# Patient Record
Sex: Male | Born: 1995 | Race: White | Hispanic: No | Marital: Single | Smoking: Never smoker
Health system: Southern US, Community
[De-identification: ages and names within clinical notes are randomized; demographics above are authoritative.]

## PROBLEM LIST (undated history)

## (undated) DIAGNOSIS — F909 Attention-deficit hyperactivity disorder, unspecified type: Secondary | ICD-10-CM

## (undated) HISTORY — PX: LASIK: SHX215

## (undated) HISTORY — DX: Attention-deficit hyperactivity disorder, unspecified type: F90.9

## (undated) HISTORY — PX: APPENDECTOMY: SHX54

---
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
## 2013-02-17 ENCOUNTER — Other Ambulatory Visit (HOSPITAL_COMMUNITY): Payer: Self-pay | Admitting: Pulmonary Disease

## 2013-02-17 DIAGNOSIS — M549 Dorsalgia, unspecified: Secondary | ICD-10-CM

## 2013-02-17 DIAGNOSIS — M25511 Pain in right shoulder: Secondary | ICD-10-CM

## 2013-02-22 ENCOUNTER — Ambulatory Visit (HOSPITAL_COMMUNITY)
Admission: RE | Admit: 2013-02-22 | Discharge: 2013-02-22 | Disposition: A | Discharge status: Home / Self Care | Payer: Managed Care, Other (non HMO) | Source: Ambulatory Visit | Attending: Pulmonary Disease | Admitting: Pulmonary Disease

## 2013-02-22 DIAGNOSIS — M549 Dorsalgia, unspecified: Secondary | ICD-10-CM

## 2013-02-22 DIAGNOSIS — M25511 Pain in right shoulder: Secondary | ICD-10-CM

## 2013-02-22 DIAGNOSIS — M25519 Pain in unspecified shoulder: Secondary | ICD-10-CM | POA: Insufficient documentation

## 2013-02-22 DIAGNOSIS — M545 Low back pain, unspecified: Secondary | ICD-10-CM | POA: Insufficient documentation

## 2013-02-22 DIAGNOSIS — M5126 Other intervertebral disc displacement, lumbar region: Secondary | ICD-10-CM | POA: Insufficient documentation

## 2013-07-29 HISTORY — PX: SHOULDER SURGERY: SHX246

## 2014-01-04 ENCOUNTER — Encounter (HOSPITAL_COMMUNITY): Payer: Self-pay | Admitting: Emergency Medicine

## 2014-01-04 ENCOUNTER — Emergency Department (HOSPITAL_COMMUNITY)
Admission: EM | Admit: 2014-01-04 | Discharge: 2014-01-05 | Disposition: A | Discharge status: Home / Self Care | Payer: BC Managed Care – PPO | Attending: Emergency Medicine | Admitting: Emergency Medicine

## 2014-01-04 DIAGNOSIS — S61019A Laceration without foreign body of unspecified thumb without damage to nail, initial encounter: Secondary | ICD-10-CM

## 2014-01-04 DIAGNOSIS — W260XXA Contact with knife, initial encounter: Secondary | ICD-10-CM | POA: Insufficient documentation

## 2014-01-04 DIAGNOSIS — S61209A Unspecified open wound of unspecified finger without damage to nail, initial encounter: Secondary | ICD-10-CM | POA: Insufficient documentation

## 2014-01-04 DIAGNOSIS — W261XXA Contact with sword or dagger, initial encounter: Secondary | ICD-10-CM

## 2014-01-04 DIAGNOSIS — Y929 Unspecified place or not applicable: Secondary | ICD-10-CM | POA: Insufficient documentation

## 2014-01-04 DIAGNOSIS — Y9389 Activity, other specified: Secondary | ICD-10-CM | POA: Insufficient documentation

## 2014-01-04 NOTE — ED Notes (Signed)
Superficial lac to lt thumb.

## 2014-01-05 MED ORDER — IBUPROFEN 800 MG PO TABS
800.0000 mg | ORAL_TABLET | Freq: Once | ORAL | Status: AC
  Administered 2014-01-05: 800 mg via ORAL
  Filled 2014-01-05: qty 1

## 2014-01-05 NOTE — ED Provider Notes (Signed)
CSN: [id number]     Arrival date & time 01/04/14  2210 History   First MD Initiated Contact with Patient 01/04/14 2321     Chief Complaint  Patient presents with  . Laceration     (Consider location/radiation/quality/duration/timing/severity/associated sxs/prior Treatment) Patient is a 18 y.o. male presenting with skin laceration. The history is provided by the patient.  Laceration Location:  Finger Finger laceration location:  L thumb Length (cm):  2 Depth:  Cutaneous Quality: straight   Bleeding: controlled   Time since incident:  1 hour Laceration mechanism:  Knife Pain details:    Quality:  Aching   Severity:  Mild   Timing:  Constant   Progression:  Unchanged Foreign body present:  No foreign bodies Relieved by:  Nothing Worsened by:  Movement Ineffective treatments:  None tried Tetanus status:  Up to date   History reviewed. No pertinent past medical history. Past Surgical History  Procedure Laterality Date  . Shoulder surgery     History reviewed. No pertinent family history. History  Substance Use Topics  . Smoking status: Never Smoker   . Smokeless tobacco: Not on file  . Alcohol Use: No    Review of Systems  Constitutional: Negative for fever and chills.  Musculoskeletal: Negative for arthralgias, back pain and joint swelling.  Skin: Positive for wound.       Laceration   Neurological: Negative for dizziness, weakness and numbness.  Hematological: Does not bruise/bleed easily.  All other systems reviewed and are negative.     Allergies  Review of patient's allergies indicates no known allergies.  Home Medications   Prior to Admission medications   Not on File   BP 122/77  Pulse 99  Temp(Src) 98.5 F (36.9 C) (Oral)  Resp 18  Ht 6' (1.829 m)  Wt 190 lb (86.183 kg)  BMI 25.76 kg/m2  SpO2 100% Physical Exam  Nursing note and vitals reviewed. Constitutional: He is oriented to person, place, and time. He appears well-developed and  well-nourished. No distress.  HENT:  Head: Normocephalic and atraumatic.  Cardiovascular: Normal rate, regular rhythm, normal heart sounds and intact distal pulses.   No murmur heard. Pulmonary/Chest: Effort normal and breath sounds normal. No respiratory distress.  Musculoskeletal: He exhibits no edema.       Left hand: He exhibits tenderness and laceration. He exhibits normal range of motion, no bony tenderness, normal two-point discrimination, no deformity and no swelling. Normal sensation noted. Normal strength noted. He exhibits no finger abduction, no thumb/finger opposition and no wrist extension trouble.  Neurological: He is alert and oriented to person, place, and time. He exhibits normal muscle tone. Coordination normal.  Skin: Skin is warm. Laceration noted.  Laceration to the distal left thumb.  Bleeding controlled.  Pt has Full ROM of the thumb.  Distal sensation intact.  radial pulse brisk    ED Course  Procedures (including critical care time) Labs Review Labs Reviewed - No data to display  Imaging Review No results found.   EKG Interpretation None       LACERATION REPAIR Performed by: Korrey Schleicher L. Kert Shackett Authorized by: Marsia Cino L. Ezekiel Menzer Consent: Verbal consent obtained. Risks and benefits: risks, benefits and alternatives were discussed Consent given by: patient Patient identity confirmed: provided demographic data Prepped and Draped in normal sterile fashion Wound explored  Laceration Location: distal left thumb Laceration Length: 2 cm  No Foreign Bodies seen or palpated  Anesthesia: none   Irrigation method: syringe Amount of cleaning: standard  Skin closure: dermabond  Technique: topical application  Patient tolerance: Patient tolerated the procedure well with no immediate complications.  MDM   Final diagnoses:  Laceration of thumb     Laceration appears superficial, bleeding controlled. Wound edges well approximated with dermabond.   NV  intact.  Dressing applied, Td is UTD.  Pt advised to return here for any signs of infection,  Appears stable for d/c   Dabney Schanz L. Trisha Mangleriplett, PA-C 01/06/14 1424

## 2014-01-06 NOTE — ED Provider Notes (Signed)
Medical screening examination/treatment/procedure(s) were performed by non-physician practitioner and as supervising physician I was immediately available for consultation/collaboration.   Charlisa Cham, MD 01/06/14 2259 

## 2018-11-29 ENCOUNTER — Emergency Department: Payer: BC Managed Care – PPO

## 2018-11-29 ENCOUNTER — Other Ambulatory Visit: Payer: Self-pay

## 2018-11-29 ENCOUNTER — Emergency Department
Admission: EM | Admit: 2018-11-29 | Discharge: 2018-11-29 | Disposition: A | Discharge status: Home / Self Care | Payer: BC Managed Care – PPO | Attending: Emergency Medicine | Admitting: Emergency Medicine

## 2018-11-29 ENCOUNTER — Encounter: Payer: Self-pay | Admitting: Emergency Medicine

## 2018-11-29 DIAGNOSIS — M25531 Pain in right wrist: Secondary | ICD-10-CM | POA: Insufficient documentation

## 2018-11-29 DIAGNOSIS — Y999 Unspecified external cause status: Secondary | ICD-10-CM | POA: Insufficient documentation

## 2018-11-29 DIAGNOSIS — Y92410 Unspecified street and highway as the place of occurrence of the external cause: Secondary | ICD-10-CM | POA: Insufficient documentation

## 2018-11-29 DIAGNOSIS — Z23 Encounter for immunization: Secondary | ICD-10-CM | POA: Insufficient documentation

## 2018-11-29 DIAGNOSIS — S93492A Sprain of other ligament of left ankle, initial encounter: Secondary | ICD-10-CM | POA: Insufficient documentation

## 2018-11-29 DIAGNOSIS — R2232 Localized swelling, mass and lump, left upper limb: Secondary | ICD-10-CM | POA: Diagnosis not present

## 2018-11-29 DIAGNOSIS — Y939 Activity, unspecified: Secondary | ICD-10-CM | POA: Insufficient documentation

## 2018-11-29 DIAGNOSIS — T07XXXA Unspecified multiple injuries, initial encounter: Secondary | ICD-10-CM | POA: Diagnosis not present

## 2018-11-29 DIAGNOSIS — S8992XA Unspecified injury of left lower leg, initial encounter: Secondary | ICD-10-CM | POA: Diagnosis present

## 2018-11-29 DIAGNOSIS — M79672 Pain in left foot: Secondary | ICD-10-CM | POA: Diagnosis not present

## 2018-11-29 LAB — COMPREHENSIVE METABOLIC PANEL
ALT: 15 U/L (ref 0–44)
AST: 22 U/L (ref 15–41)
Albumin: 4.8 g/dL (ref 3.5–5.0)
Alkaline Phosphatase: 65 U/L (ref 38–126)
Anion gap: 13 (ref 5–15)
BUN: 12 mg/dL (ref 6–20)
CO2: 22 mmol/L (ref 22–32)
Calcium: 9.3 mg/dL (ref 8.9–10.3)
Chloride: 100 mmol/L (ref 98–111)
Creatinine, Ser: 1.28 mg/dL — ABNORMAL HIGH (ref 0.61–1.24)
GFR calc Af Amer: >60 mL/min (ref >60)
GFR calc non Af Amer: >60 mL/min (ref >60)
Glucose, Bld: 143 mg/dL — ABNORMAL HIGH (ref 70–99)
Potassium: 3.7 mmol/L (ref 3.5–5.1)
Sodium: 135 mmol/L (ref 135–145)
Total Bilirubin: 1.9 mg/dL — ABNORMAL HIGH (ref 0.3–1.2)
Total Protein: 7.5 g/dL (ref 6.5–8.1)

## 2018-11-29 LAB — CBC WITH DIFFERENTIAL/PLATELET
Abs Immature Granulocytes: 0.05 10*3/uL (ref 0.00–0.07)
Basophils Absolute: 0 10*3/uL (ref 0.0–0.1)
Basophils Relative: 0 %
Eosinophils Absolute: 0 10*3/uL (ref 0.0–0.5)
Eosinophils Relative: 0 %
HCT: 45.6 % (ref 39.0–52.0)
Hemoglobin: 16 g/dL (ref 13.0–17.0)
Immature Granulocytes: 0 %
Lymphocytes Relative: 10 %
Lymphs Abs: 1.6 10*3/uL (ref 0.7–4.0)
MCH: 30.9 pg (ref 26.0–34.0)
MCHC: 35.1 g/dL (ref 30.0–36.0)
MCV: 88 fL (ref 80.0–100.0)
Monocytes Absolute: 1 10*3/uL (ref 0.1–1.0)
Monocytes Relative: 6 %
Neutro Abs: 13.1 10*3/uL — ABNORMAL HIGH (ref 1.7–7.7)
Neutrophils Relative %: 84 %
Platelets: 263 10*3/uL (ref 150–400)
RBC: 5.18 MIL/uL (ref 4.22–5.81)
RDW: 11.8 % (ref 11.5–15.5)
WBC: 15.7 10*3/uL — ABNORMAL HIGH (ref 4.0–10.5)
nRBC: 0 % (ref 0.0–0.2)

## 2018-11-29 LAB — SAMPLE TO BLOOD BANK

## 2018-11-29 LAB — LIPASE, BLOOD: Lipase: 27 U/L (ref 11–51)

## 2018-11-29 MED ORDER — SODIUM CHLORIDE 0.9 % IV BOLUS
500.0000 mL | Freq: Once | INTRAVENOUS | Status: AC
Start: 2018-11-29 — End: 2018-11-29
  Administered 2018-11-29: 500 mL via INTRAVENOUS

## 2018-11-29 MED ORDER — TRAMADOL HCL 50 MG PO TABS: 50.0000 mg | ORAL_TABLET | Freq: Four times a day (QID) | ORAL | 0 refills | Status: DC | PRN

## 2018-11-29 MED ORDER — ONDANSETRON HCL 4 MG/2ML IJ SOLN
4.0000 mg | Freq: Once | INTRAMUSCULAR | Status: AC
  Administered 2018-11-29: 4 mg via INTRAVENOUS
  Filled 2018-11-29: qty 2

## 2018-11-29 MED ORDER — TETANUS-DIPHTH-ACELL PERTUSSIS 5-2.5-18.5 LF-MCG/0.5 IM SUSP
0.5000 mL | Freq: Once | INTRAMUSCULAR | Status: AC
  Administered 2018-11-29: 22:00:00 0.5 mL via INTRAMUSCULAR
  Filled 2018-11-29: qty 0.5

## 2018-11-29 MED ORDER — HYDROMORPHONE HCL 1 MG/ML IJ SOLN
1.0000 mg | Freq: Once | INTRAMUSCULAR | Status: AC
  Administered 2018-11-29: 21:00:00 1 mg via INTRAVENOUS
  Filled 2018-11-29: qty 1
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
## 2018-11-29 NOTE — Discharge Instructions (Signed)
Your CT scan of the head and neck did not reveal any significant injuries.  Your xrays of the right wrist, left foot, and left lower leg were also okay.  You have a sprained left ankle, and you should keep it iced and wrapped with a compression bandage for the next 48 hours.   We gave you a tetanus vaccine today.

## 2018-11-29 NOTE — ED Notes (Signed)
Pt given 100 mcg fentanyl PTA by EMS °

## 2018-12-02 ENCOUNTER — Telehealth: Payer: Self-pay | Admitting: Orthopedic Surgery

## 2018-12-02 NOTE — Telephone Encounter (Signed)
Are there any fractures ???
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
## 2018-12-03 NOTE — Telephone Encounter (Signed)
Patient was told there were no fractures. He has seen you in the past and feels more comfortable being checked out by you.

## 2018-12-03 NOTE — Telephone Encounter (Signed)
Ok Friday tomorrow
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
## 2018-12-14 ENCOUNTER — Encounter: Payer: Self-pay | Admitting: Orthopedic Surgery

## 2018-12-14 ENCOUNTER — Other Ambulatory Visit: Payer: Self-pay

## 2018-12-14 ENCOUNTER — Ambulatory Visit: Payer: BC Managed Care – PPO | Admitting: Orthopedic Surgery

## 2018-12-14 VITALS — BP 118/70 | Temp 97.4°F | Ht 70.0 in | Wt 205.0 lb

## 2018-12-14 DIAGNOSIS — M25521 Pain in right elbow: Secondary | ICD-10-CM

## 2018-12-14 DIAGNOSIS — M25531 Pain in right wrist: Secondary | ICD-10-CM | POA: Diagnosis not present

## 2018-12-14 NOTE — Patient Instructions (Signed)
shoulder get better with time and stretching
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
## 2019-06-28 ENCOUNTER — Other Ambulatory Visit: Payer: Self-pay

## 2019-06-28 DIAGNOSIS — Z20822 Contact with and (suspected) exposure to covid-19: Secondary | ICD-10-CM

## 2019-06-29 LAB — NOVEL CORONAVIRUS, NAA: SARS-CoV-2, NAA: NOT DETECTED

## 2019-08-12 ENCOUNTER — Ambulatory Visit: Payer: Managed Care, Other (non HMO) | Attending: Internal Medicine

## 2019-08-12 ENCOUNTER — Other Ambulatory Visit: Payer: Self-pay

## 2019-08-12 DIAGNOSIS — Z20822 Contact with and (suspected) exposure to covid-19: Secondary | ICD-10-CM

## 2019-08-13 LAB — NOVEL CORONAVIRUS, NAA: SARS-CoV-2, NAA: NOT DETECTED

## 2019-11-22 ENCOUNTER — Ambulatory Visit: Payer: BC Managed Care – PPO | Admitting: Family Medicine

## 2020-08-29 ENCOUNTER — Other Ambulatory Visit: Payer: Self-pay

## 2020-08-29 ENCOUNTER — Encounter: Payer: Self-pay | Admitting: Nurse Practitioner

## 2020-08-29 ENCOUNTER — Ambulatory Visit (INDEPENDENT_AMBULATORY_CARE_PROVIDER_SITE_OTHER): Payer: Self-pay | Admitting: Nurse Practitioner

## 2020-08-29 DIAGNOSIS — F909 Attention-deficit hyperactivity disorder, unspecified type: Secondary | ICD-10-CM

## 2020-08-29 DIAGNOSIS — Z139 Encounter for screening, unspecified: Secondary | ICD-10-CM

## 2020-08-29 DIAGNOSIS — Z7689 Persons encountering health services in other specified circumstances: Secondary | ICD-10-CM

## 2020-08-29 MED ORDER — AMPHETAMINE-DEXTROAMPHETAMINE 20 MG PO TABS: 20.0000 mg | ORAL_TABLET | Freq: Every day | ORAL | 0 refills | Status: DC

## 2020-08-29 NOTE — Assessment & Plan Note (Signed)
-  Will screen for HIV and HCV with next set of labs

## 2020-08-29 NOTE — Patient Instructions (Addendum)
It was great seeing you again today.  I'm glad you found me.  We will meet back up in 1 month for lab work. Please have fasting labs drawn 2-3 days prior to this appointment.

## 2020-08-29 NOTE — Progress Notes (Signed)
New Patient Office Visit  Subjective:  Patient ID: Daniel Wallace, male    DOB: May 27, 1996  Age: 25 y.o. MRN: [id number]  CC:  Chief Complaint  Patient presents with  . New Patient (Initial Visit)    HPI Daniel Wallace presents for new patient visit. Transferring care from Daniel Neighbors, MD. Last labs were several months ago. Last physical at urgent care in [facility] (? [facility]) about 1.5 months ago prior to BLET class.  Past Medical History:  Diagnosis Date  . ADHD     Past Surgical History:  Procedure Laterality Date  . APPENDECTOMY    . LASIK     both eyes around 2017  . SHOULDER SURGERY Right 2015    Family History  Problem Relation Age of Onset  . Diabetes Mother   . Diabetes Father   . Stroke Father     Social History   Socioeconomic History  . Marital status: Single    Spouse name: Not on file  . Number of children: Not on file  . Years of education: Not on file  . Highest education level: Not on file  Occupational History  . 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 Smoking status: Never Smoker  . Smokeless tobacco: Current User    Types: Snuff, Chew  . Tobacco comment: started around age 34  Vaping Use  . Vaping Use: Former  Substance and Sexual Activity  . Alcohol use: Yes    Comment: rarely, maybe 1 drink per month  . Drug use: No  . Sexual activity: Yes    Birth control/protection: Pill  Other Topics Concern  . Not on file  Social History Narrative  . Not on file   Social Determinants of Health   Financial Resource Strain: Not on file  Food Insecurity: Not on file  Transportation Needs: Not on file  Physical Activity: Not on file  Stress: Not on file  Social Connections: Not on file  Intimate Partner Violence: Not on file    ROS Review of Systems  Constitutional: Negative.   Respiratory: Negative.   Cardiovascular: Negative.   Psychiatric/Behavioral: Negative.        -ADHD  well-managed with Adderall     Objective:   Today's Vitals: BP 121/82   Pulse 94   Temp 98.5 F (36.9 C)   Resp 18   Ht 6' (1.829 m)   Wt 194 lb (88 kg)   SpO2 97%   BMI 26.31 kg/m   Physical Exam Constitutional:      Appearance: Normal appearance.  Cardiovascular:     Rate and Rhythm: Normal rate and regular rhythm.     Pulses: Normal pulses.     Heart sounds: Normal heart sounds.  Pulmonary:     Effort: Pulmonary effort is normal.     Breath sounds: Normal breath sounds.  Neurological:     Mental Status: He is alert.  Psychiatric:        Mood and Affect: Mood normal.        Behavior: Behavior normal.        Thought Content: Thought content normal.        Judgment: Judgment normal.     Assessment & Plan:   Problem List Items Addressed This Visit      Other   Encounter to establish care    -request records from Daniel Neighbors, MD and Urgent Care in [place]  Relevant Orders   CBC with Differential/Platelet   CMP14+EGFR   HCV Ab w/Rflx to Verification   HIV Antibody (routine testing w rflx)   Lipid Panel With LDL/HDL Ratio   ADHD    -doing well with current dosage -refilled today      Screening due    -Will screen for HIV and HCV with next set of labs      Relevant Orders   HCV Ab w/Rflx to Verification   HIV Antibody (routine testing w rflx)      Outpatient Encounter Medications as of 08/29/2020  Medication Sig  . [DISCONTINUED] amphetamine-dextroamphetamine (ADDERALL) 20 MG tablet Take 20 mg by mouth daily.  Marland Kitchen amphetamine-dextroamphetamine (ADDERALL) 20 MG tablet Take 1 tablet (20 mg total) by mouth daily.  . [DISCONTINUED] HYDROcodone-acetaminophen (NORCO/VICODIN) 5-325 MG tablet Take 1 tablet by mouth every 6 (six) hours as needed for moderate pain. (Patient not taking: Reported on 08/29/2020)  . [DISCONTINUED] traMADol (ULTRAM) 50 MG tablet Take 1 tablet (50 mg total) by mouth every 6 (six) hours as needed. (Patient not taking: Reported on 08/29/2020)    No facility-administered encounter medications on file as of 08/29/2020.     Follow-up: Return in about 1 month (around 09/26/2020) for Lab follow-up.   Noreene Larsson, NP

## 2020-08-29 NOTE — Assessment & Plan Note (Signed)
-  doing well with current dosage -refilled today

## 2020-08-29 NOTE — Assessment & Plan Note (Signed)
-  request records from Dwana Melena, MD and Urgent Care in [place]

## 2020-09-26 ENCOUNTER — Ambulatory Visit (INDEPENDENT_AMBULATORY_CARE_PROVIDER_SITE_OTHER): Payer: Self-pay | Admitting: Nurse Practitioner

## 2020-09-26 ENCOUNTER — Encounter: Payer: Self-pay | Admitting: Nurse Practitioner

## 2020-09-26 ENCOUNTER — Other Ambulatory Visit: Payer: Self-pay

## 2020-09-26 DIAGNOSIS — F909 Attention-deficit hyperactivity disorder, unspecified type: Secondary | ICD-10-CM

## 2020-09-26 DIAGNOSIS — R7301 Impaired fasting glucose: Secondary | ICD-10-CM

## 2020-09-26 LAB — LIPID PANEL WITH LDL/HDL RATIO
Cholesterol, Total: 142 mg/dL (ref 100–199)
HDL: 36 mg/dL — ABNORMAL LOW (ref >39)
LDL Chol Calc (NIH): 86 mg/dL (ref 0–99)
LDL/HDL Ratio: 2.4 ratio (ref 0.0–3.6)
Triglycerides: 110 mg/dL (ref 0–149)
VLDL Cholesterol Cal: 20 mg/dL (ref 5–40)

## 2020-09-26 LAB — CBC WITH DIFFERENTIAL/PLATELET
Basophils Absolute: 0.1 10*3/uL (ref 0.0–0.2)
Basos: 1 %
EOS (ABSOLUTE): 0.1 10*3/uL (ref 0.0–0.4)
Eos: 2 %
Hematocrit: 44 % (ref 37.5–51.0)
Hemoglobin: 14.9 g/dL (ref 13.0–17.7)
Immature Grans (Abs): 0 10*3/uL (ref 0.0–0.1)
Immature Granulocytes: 0 %
Lymphocytes Absolute: 1.7 10*3/uL (ref 0.7–3.1)
Lymphs: 37 %
MCH: 30.3 pg (ref 26.6–33.0)
MCHC: 33.9 g/dL (ref 31.5–35.7)
MCV: 90 fL (ref 79–97)
Monocytes Absolute: 0.5 10*3/uL (ref 0.1–0.9)
Monocytes: 10 %
Neutrophils Absolute: 2.2 10*3/uL (ref 1.4–7.0)
Neutrophils: 50 %
Platelets: 268 10*3/uL (ref 150–450)
RBC: 4.91 x10E6/uL (ref 4.14–5.80)
RDW: 12 % (ref 11.6–15.4)
WBC: 4.5 10*3/uL (ref 3.4–10.8)

## 2020-09-26 LAB — HCV INTERPRETATION

## 2020-09-26 LAB — CMP14+EGFR
ALT: 16 IU/L (ref 0–44)
AST: 13 IU/L (ref 0–40)
Albumin/Globulin Ratio: 1.8 (ref 1.2–2.2)
Albumin: 4.3 g/dL (ref 4.1–5.2)
Alkaline Phosphatase: 88 IU/L (ref 44–121)
BUN/Creatinine Ratio: 9 (ref 9–20)
BUN: 10 mg/dL (ref 6–20)
Bilirubin Total: 1.6 mg/dL — ABNORMAL HIGH (ref 0.0–1.2)
CO2: 24 mmol/L (ref 20–29)
Calcium: 9.6 mg/dL (ref 8.7–10.2)
Chloride: 101 mmol/L (ref 96–106)
Creatinine, Ser: 1.15 mg/dL (ref 0.76–1.27)
Globulin, Total: 2.4 g/dL (ref 1.5–4.5)
Glucose: 124 mg/dL — ABNORMAL HIGH (ref 65–99)
Potassium: 4.8 mmol/L (ref 3.5–5.2)
Sodium: 139 mmol/L (ref 134–144)
Total Protein: 6.7 g/dL (ref 6.0–8.5)
eGFR: 90 mL/min/{1.73_m2} (ref >59)

## 2020-09-26 LAB — HIV ANTIBODY (ROUTINE TESTING W REFLEX): HIV Screen 4th Generation wRfx: NONREACTIVE

## 2020-09-26 LAB — HCV AB W/RFLX TO VERIFICATION: HCV Ab: 0.1 s/co ratio (ref 0.0–0.9)

## 2020-09-26 NOTE — Assessment & Plan Note (Signed)
-  continue adderall -he states he has a few pills leftover; takes daily prior to BLET class and it has been helping him with concentration -will refill when due

## 2020-09-26 NOTE — Progress Notes (Signed)
Established Patient Office Visit  Subjective:  Patient ID: Daniel Wallace, male    DOB: 25-Oct-1995  Age: 25 y.o. MRN: [id number]  CC:  Chief Complaint  Patient presents with  . Follow-up    Go over labs, med check for ADHD     HPI Daniel Wallace presents for lab follow-up. He has hx of ADHD He recently started a BLET class.  Past Medical History:  Diagnosis Date  . ADHD     Past Surgical History:  Procedure Laterality Date  . APPENDECTOMY    . LASIK     both eyes around 2017  . SHOULDER SURGERY Right 2015    Family History  Problem Relation Age of Onset  . Diabetes Mother   . Diabetes Father   . Stroke Father     Social History   Socioeconomic History  . Marital status: Single    Spouse name: Not on file  . Number of children: Not on file  . Years of education: Not on file  . Highest education level: Not on file  Occupational History  . 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 Occupation: Occupational psychologist  Tobacco Use  . Smoking status: Never Smoker  . Smokeless tobacco: Current User    Types: Snuff, Chew  . Tobacco comment: started around age 77  Vaping Use  . Vaping Use: Former  Substance and Sexual Activity  . Alcohol use: Yes    Comment: rarely, maybe 1 drink per month  . Drug use: No  . Sexual activity: Yes    Birth control/protection: Pill  Other Topics Concern  . Not on file  Social History Narrative  . Not on file   Social Determinants of Health   Financial Resource Strain: Not on file  Food Insecurity: Not on file  Transportation Needs: Not on file  Physical Activity: Not on file  Stress: Not on file  Social Connections: Not on file  Intimate Partner Violence: Not on file    Outpatient Medications Prior to Visit  Medication Sig Dispense Refill  . amphetamine-dextroamphetamine (ADDERALL) 20 MG tablet Take 1 tablet (20 mg total) by mouth daily. 30 tablet 0   No facility-administered medications prior to visit.     No Known Allergies  ROS Review of Systems  Constitutional: Negative.   Respiratory: Negative.   Cardiovascular: Negative.   Musculoskeletal: Negative.   Psychiatric/Behavioral: Negative.        ADHD well controlled with meds      Objective:    Physical Exam Constitutional:      Appearance: Normal appearance.  Cardiovascular:     Rate and Rhythm: Normal rate and regular rhythm.     Pulses: Normal pulses.     Heart sounds: Normal heart sounds.  Pulmonary:     Effort: Pulmonary effort is normal.     Breath sounds: Normal breath sounds.  Musculoskeletal:        General: Normal range of motion.  Neurological:     Mental Status: He is alert.  Psychiatric:        Mood and Affect: Mood normal.        Behavior: Behavior normal.        Thought Content: Thought content normal.        Judgment: Judgment normal.     BP 127/81   Pulse 93   Temp 99 F (37.2 C)   Resp 18   Ht 6' (1.829 m)   Wt 188 lb (  85.3 kg)   SpO2 98%   BMI 25.50 kg/m  Wt Readings from Last 3 Encounters:  09/26/20 188 lb (85.3 kg)  08/29/20 194 lb (88 kg)  12/14/18 205 lb (93 kg)     Health Maintenance Due  Topic Date Due  . HPV VACCINES (1 - Male 2-dose series) Never done  . INFLUENZA VACCINE  Never done       Topic Date Due  . HPV VACCINES (1 - Male 2-dose series) Never done    No results found for: TSH Lab Results  Component Value Date   WBC 4.5 09/25/2020   HGB 14.9 09/25/2020   HCT 44.0 09/25/2020   MCV 90 09/25/2020   PLT 268 09/25/2020   Lab Results  Component Value Date   NA 139 09/25/2020   K 4.8 09/25/2020   CO2 24 09/25/2020   GLUCOSE 124 (H) 09/25/2020   BUN 10 09/25/2020   CREATININE 1.15 09/25/2020   BILITOT 1.6 (H) 09/25/2020   ALKPHOS 88 09/25/2020   AST 13 09/25/2020   ALT 16 09/25/2020   PROT 6.7 09/25/2020   ALBUMIN 4.3 09/25/2020   CALCIUM 9.6 09/25/2020   ANIONGAP 13 11/29/2018   Lab Results  Component Value Date   CHOL 142 09/25/2020   Lab  Results  Component Value Date   HDL 36 (L) 09/25/2020   Lab Results  Component Value Date   LDLCALC 86 09/25/2020   Lab Results  Component Value Date   TRIG 110 09/25/2020   No results found for: CHOLHDL No results found for: TMHD6Q    Assessment & Plan:   Problem List Items Addressed This Visit      Endocrine   Impaired fasting glucose    -glucose was elevated at 124 -A1c added to labs -he states he was not fasting prior to labs; had a an AM protein shake        Other   ADHD    -continue adderall -he states he has a few pills leftover; takes daily prior to BLET class and it has been helping him with concentration -will refill when due         No orders of the defined types were placed in this encounter.   Follow-up: Return in about 3 months (around 12/27/2020) for Med check (ADHD).    Heather Roberts, NP

## 2020-09-26 NOTE — Assessment & Plan Note (Addendum)
-  glucose was elevated at 124 -A1c added to labs -he states he was not fasting prior to labs; had a an AM protein shake

## 2020-09-28 LAB — SPECIMEN STATUS REPORT

## 2020-09-28 LAB — HGB A1C W/O EAG: Hgb A1c MFr Bld: 5.1 % (ref 4.8–5.6)

## 2020-09-28 NOTE — Progress Notes (Signed)
His A1c was great at 5.1; no diabetes or reason to change or add meds.

## 2020-10-10 ENCOUNTER — Telehealth: Payer: Self-pay

## 2020-10-10 ENCOUNTER — Other Ambulatory Visit: Payer: Self-pay | Admitting: Nurse Practitioner

## 2020-10-10 MED ORDER — AMPHETAMINE-DEXTROAMPHETAMINE 20 MG PO TABS: 20.0000 mg | ORAL_TABLET | Freq: Every day | ORAL | 0 refills | Status: DC

## 2020-10-10 NOTE — Telephone Encounter (Signed)
sent 

## 2020-10-10 NOTE — Telephone Encounter (Signed)
Pt said you told him to let you know when he was running low on his Adderall, he took his last one today. Can you send refill in for him to the pharmacy?

## 2020-10-27 IMAGING — DX LEFT TIBIA AND FIBULA - 2 VIEW
4 series · 4 of 4 positions shown · non-contrast
Comparison: None.

CLINICAL DATA: Pain after trauma.

EXAM:
LEFT TIBIA AND FIBULA - 2 VIEW

[tibia ap (1 of 2)]
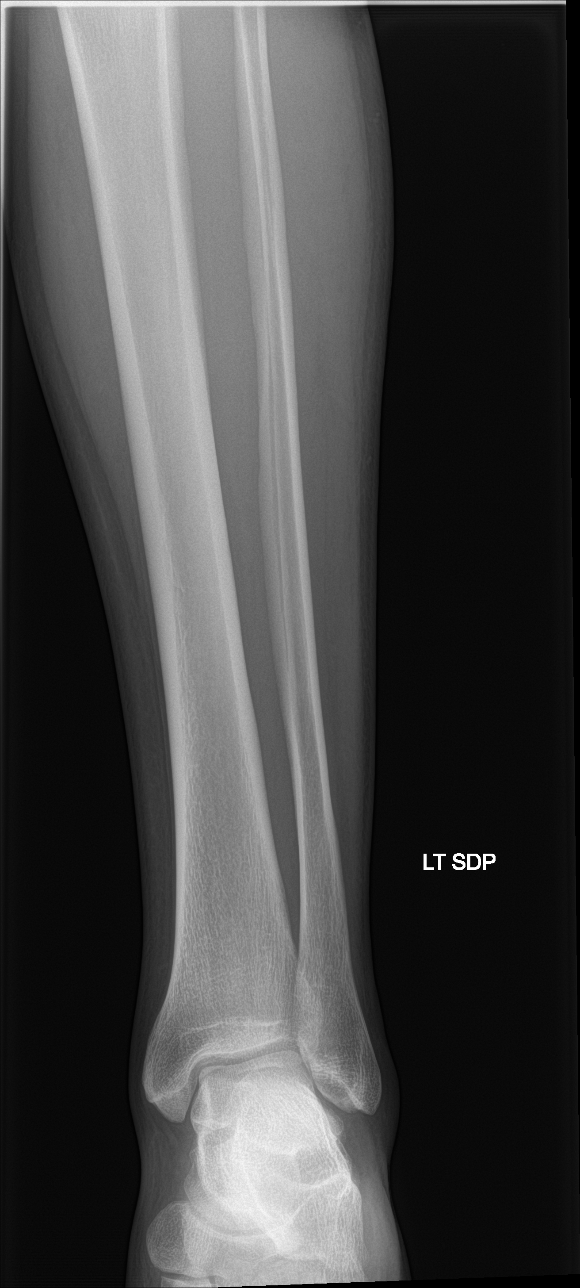

[tibia ap (2 of 2)]
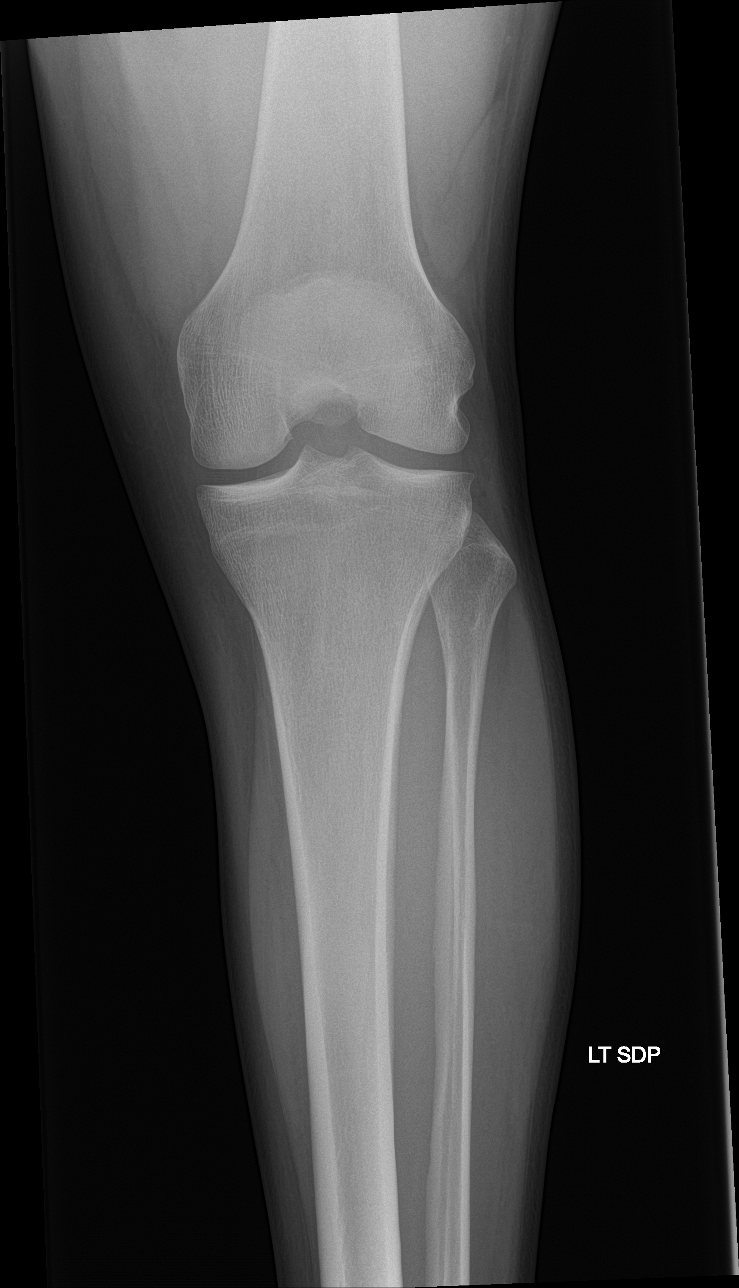

[tibia lat (1 of 2)]
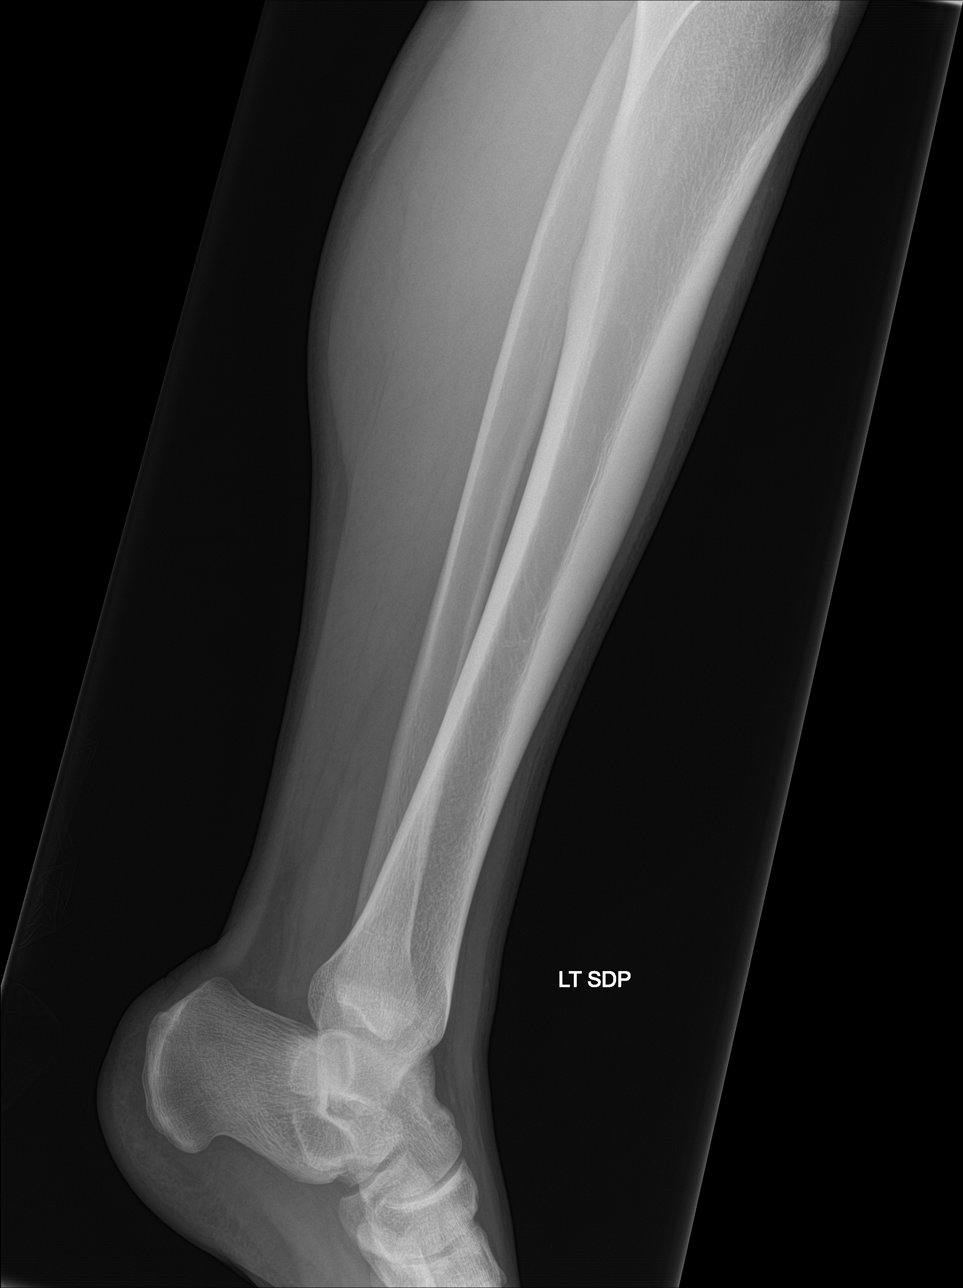

[tibia lat (2 of 2)]
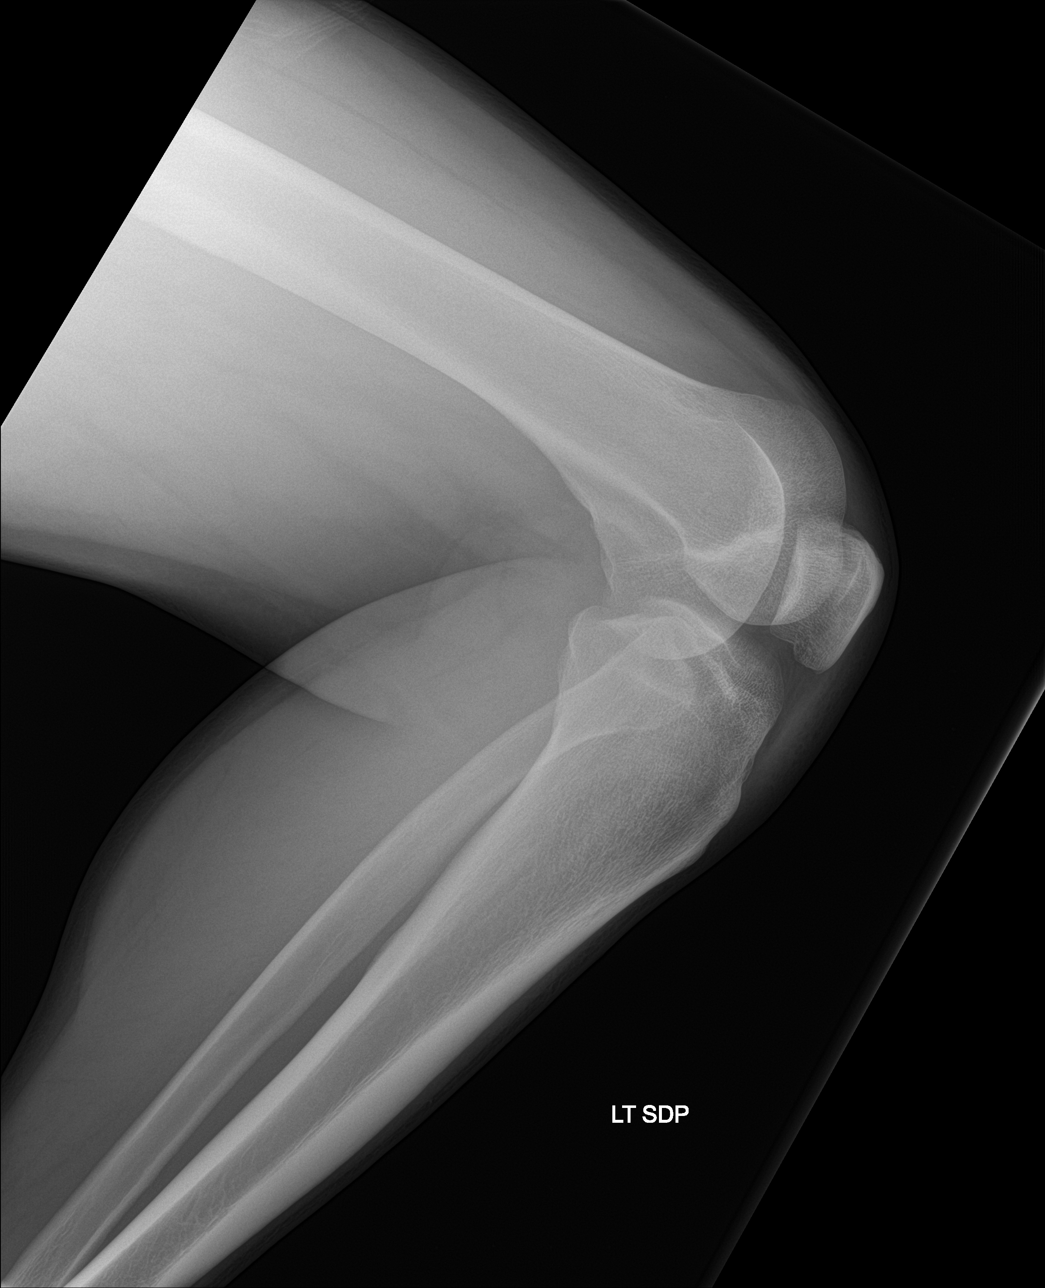

[4 of 4 positions shown; findings below may reference images not displayed]

FINDINGS: There is no evidence of fracture or other focal bone lesions. Soft
tissues are unremarkable.
IMPRESSION: Negative.

## 2020-11-09 ENCOUNTER — Other Ambulatory Visit: Payer: Self-pay | Admitting: Nurse Practitioner

## 2020-12-20 ENCOUNTER — Other Ambulatory Visit: Payer: Self-pay | Admitting: Nurse Practitioner

## 2020-12-27 ENCOUNTER — Other Ambulatory Visit: Payer: Self-pay

## 2020-12-27 ENCOUNTER — Ambulatory Visit (INDEPENDENT_AMBULATORY_CARE_PROVIDER_SITE_OTHER): Payer: No Typology Code available for payment source | Admitting: Nurse Practitioner

## 2020-12-27 ENCOUNTER — Encounter: Payer: Self-pay | Admitting: Nurse Practitioner

## 2020-12-27 DIAGNOSIS — F909 Attention-deficit hyperactivity disorder, unspecified type: Secondary | ICD-10-CM | POA: Diagnosis not present

## 2020-12-27 NOTE — Progress Notes (Signed)
Acute Office Visit  Subjective:    Patient ID: Daniel Wallace, male    DOB: 15-Nov-1995, 25 y.o.   MRN: [id number]  Chief Complaint  Patient presents with  . ADHD    Follow up     HPI Patient is in today for med check. He has been taking adderall for ADHD, and takes 20 mg daily prior to his BLET class.  It helps him with concentration, and last fill was 12/20/20.  Last labs were drawn 09/25/20.  Past Medical History:  Diagnosis Date  . ADHD     Past Surgical History:  Procedure Laterality Date  . APPENDECTOMY    . LASIK     both eyes around 2017  . SHOULDER SURGERY Right 2015    Family History  Problem Relation Age of Onset  . Diabetes Mother   . Diabetes Father   . Stroke Father     Social History   Socioeconomic History  . Marital status: Single    Spouse name: Not on file  . Number of children: Not on file  . Years of education: Not on file  . Highest education level: Not on file  Occupational History  . 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 Smoking status: Never Smoker  . Smokeless tobacco: Current User    Types: Snuff, Chew  . Tobacco comment: started around age 62  Vaping Use  . Vaping Use: Former  Substance and Sexual Activity  . Alcohol use: Yes    Comment: rarely, maybe 1 drink per month  . Drug use: No  . Sexual activity: Yes    Birth control/protection: Pill  Other Topics Concern  . Not on file  Social History Narrative  . Not on file   Social Determinants of Health   Financial Resource Strain: Not on file  Food Insecurity: Not on file  Transportation Needs: Not on file  Physical Activity: Not on file  Stress: Not on file  Social Connections: Not on file  Intimate Partner Violence: Not on file    Outpatient Medications Prior to Visit  Medication Sig Dispense Refill  . amphetamine-dextroamphetamine (ADDERALL) 20 MG tablet TAKE ONE TABLET ($RemoveBef'20MG'ekWlmGOJtN$  TOTAL) BY MOUTH DAILY 30  tablet 0   No facility-administered medications prior to visit.    No Known Allergies  Review of Systems  Constitutional: Negative.   Respiratory: Negative.   Cardiovascular: Negative.   Musculoskeletal: Negative.   Psychiatric/Behavioral: Negative.        Objective:    Physical Exam Constitutional:      Appearance: Normal appearance.  Cardiovascular:     Rate and Rhythm: Normal rate and regular rhythm.     Pulses: Normal pulses.     Heart sounds: Normal heart sounds.  Pulmonary:     Effort: Pulmonary effort is normal.     Breath sounds: Normal breath sounds.  Neurological:     Mental Status: He is alert.  Psychiatric:        Mood and Affect: Mood normal.        Behavior: Behavior normal.        Thought Content: Thought content normal.        Judgment: Judgment normal.     BP 129/88   Pulse 98   Temp 98.8 F (37.1 C)   Resp 18   Ht 6' (1.829 m)   Wt 193 lb (87.5 kg)   SpO2 97%   BMI  26.18 kg/m  Wt Readings from Last 3 Encounters:  12/27/20 193 lb (87.5 kg)  09/26/20 188 lb (85.3 kg)  08/29/20 194 lb (88 kg)    There are no preventive care reminders to display for this patient.  There are no preventive care reminders to display for this patient.   No results found for: TSH Lab Results  Component Value Date   WBC 4.5 09/25/2020   HGB 14.9 09/25/2020   HCT 44.0 09/25/2020   MCV 90 09/25/2020   PLT 268 09/25/2020   Lab Results  Component Value Date   NA 139 09/25/2020   K 4.8 09/25/2020   CO2 24 09/25/2020   GLUCOSE 124 (H) 09/25/2020   BUN 10 09/25/2020   CREATININE 1.15 09/25/2020   BILITOT 1.6 (H) 09/25/2020   ALKPHOS 88 09/25/2020   AST 13 09/25/2020   ALT 16 09/25/2020   PROT 6.7 09/25/2020   ALBUMIN 4.3 09/25/2020   CALCIUM 9.6 09/25/2020   ANIONGAP 13 11/29/2018   EGFR 90 09/25/2020   Lab Results  Component Value Date   CHOL 142 09/25/2020   Lab Results  Component Value Date   HDL 36 (L) 09/25/2020   Lab Results   Component Value Date   LDLCALC 86 09/25/2020   Lab Results  Component Value Date   TRIG 110 09/25/2020   No results found for: CHOLHDL Lab Results  Component Value Date   HGBA1C 5.1 09/25/2020       Assessment & Plan:   Problem List Items Addressed This Visit      Other   ADHD    -continue adderall -he states he has a few pills leftover; takes daily prior to BLET class and it has been helping him with concentration -will refill when due          No orders of the defined types were placed in this encounter.    Noreene Larsson, NP

## 2020-12-27 NOTE — Assessment & Plan Note (Addendum)
-  continue adderall -doesn't use adderall daily; just on days prior to BLET class and it has been helping him with concentration -will refill when due -UDS today

## 2021-01-10 LAB — DRUG SCREEN 12+ALCOHOL+CRT, UR
Amphetamines, Urine: POSITIVE — AB
BENZODIAZ UR QL: NEGATIVE ng/mL
Barbiturate: NEGATIVE ng/mL
Cannabinoids: NEGATIVE ng/mL
Cocaine (Metabolite): NEGATIVE ng/mL
Creatinine, Urine: 304.4 mg/dL — ABNORMAL HIGH (ref 20.0–300.0)
Ethanol, Urine: NEGATIVE %
Meperidine: NEGATIVE ng/mL
Methadone: NEGATIVE ng/mL
OPIATE SCREEN URINE: NEGATIVE ng/mL
Oxycodone/Oxymorphone, Urine: NEGATIVE ng/mL
Phencyclidine: NEGATIVE ng/mL
Propoxyphene: NEGATIVE ng/mL
Tramadol: NEGATIVE ng/mL

## 2021-01-10 NOTE — Progress Notes (Signed)
Pt informed

## 2021-01-10 NOTE — Progress Notes (Signed)
Left message

## 2021-01-10 NOTE — Progress Notes (Signed)
Drug screen was good. Only tested positive for amphetamines, but he is prescribed those.

## 2021-03-29 ENCOUNTER — Encounter: Payer: Self-pay | Admitting: Nurse Practitioner

## 2021-03-29 ENCOUNTER — Ambulatory Visit (INDEPENDENT_AMBULATORY_CARE_PROVIDER_SITE_OTHER): Payer: Self-pay | Admitting: Nurse Practitioner

## 2021-03-29 ENCOUNTER — Other Ambulatory Visit: Payer: Self-pay

## 2021-03-29 VITALS — BP 112/72 | HR 85 | Temp 98.4°F | Ht 72.0 in | Wt 208.0 lb

## 2021-03-29 DIAGNOSIS — R7301 Impaired fasting glucose: Secondary | ICD-10-CM

## 2021-03-29 DIAGNOSIS — F909 Attention-deficit hyperactivity disorder, unspecified type: Secondary | ICD-10-CM

## 2021-03-29 MED ORDER — AMPHETAMINE-DEXTROAMPHETAMINE 20 MG PO TABS
ORAL_TABLET | ORAL | 0 refills | Status: DC
Start: 2021-03-29 — End: 2021-05-28

## 2021-03-29 NOTE — Patient Instructions (Signed)
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 Wende Crease can be reached at [phone].

## 2021-03-29 NOTE — Assessment & Plan Note (Signed)
Lab Results  Component Value Date   HGBA1C 5.1 09/25/2020

## 2021-03-29 NOTE — Progress Notes (Signed)
Acute Office Visit  Subjective:    Patient ID: Daniel Wallace, male    DOB: 27-Oct-1995, 27 y.o.   MRN: [id number]  Chief Complaint  Patient presents with   ADHD    Follow up    HPI Patient is in today for med check for adderall.  He is taking adderall only when he works, and he was hired by [employer].  He states he feels much more attentive with the medication.  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  Tobacco Use   Smoking status: Never   Smokeless tobacco: Current    Types: Snuff, Chew   Tobacco comments:    started around age 61  Vaping Use   Vaping Use: Former  Substance and Sexual Activity   Alcohol use: Yes    Comment: rarely, maybe 1 drink per month   Drug use: No   Sexual activity: Yes    Birth control/protection: Pill  Other Topics Concern   Not on file  Social History Narrative   Not on file   Social Determinants of Health   Financial Resource Strain: Not on file  Food Insecurity: Not on file  Transportation Needs: Not on file  Physical Activity: Not on file  Stress: Not on file  Social Connections: Not on file  Intimate Partner Violence: Not on file    Outpatient Medications Prior to Visit  Medication Sig Dispense Refill   amphetamine-dextroamphetamine (ADDERALL) 20 MG tablet TAKE ONE TABLET ($RemoveBef'20MG'GPQXWlkTts$  TOTAL) BY MOUTH DAILY 30 tablet 0   No facility-administered medications prior  to visit.    No Known Allergies  Review of Systems  Constitutional: Negative.   Respiratory: Negative.    Cardiovascular: Negative.   Musculoskeletal: Negative.   Psychiatric/Behavioral: Negative.        Objective:    Physical Exam Constitutional:      Appearance: Normal appearance.  Cardiovascular:     Rate and Rhythm: Normal rate and regular rhythm.     Pulses: Normal pulses.     Heart sounds: Normal heart sounds.  Pulmonary:     Effort: Pulmonary effort is normal.     Breath sounds: Normal breath sounds.  Musculoskeletal:        General: Normal range of motion.  Neurological:     Mental Status: He is alert.  Psychiatric:        Mood and Affect: Mood normal.        Behavior: Behavior normal.        Thought Content: Thought content normal.        Judgment: Judgment normal.    BP 112/72 (BP Location: Left Arm, Patient Position: Sitting, Cuff Size: Large)   Pulse 85   Temp 98.4 F (36.9 C) (Oral)   Ht  6' (1.829 m)   Wt 208 lb (94.3 kg)   SpO2 98%   BMI 28.21 kg/m  Wt Readings from Last 3 Encounters:  03/29/21 208 lb (94.3 kg)  12/27/20 193 lb (87.5 kg)  09/26/20 188 lb (85.3 kg)    There are no preventive care reminders to display for this patient.  There are no preventive care reminders to display for this patient.   No results found for: TSH Lab Results  Component Value Date   WBC 4.5 09/25/2020   HGB 14.9 09/25/2020   HCT 44.0 09/25/2020   MCV 90 09/25/2020   PLT 268 09/25/2020   Lab Results  Component Value Date   NA 139 09/25/2020   K 4.8 09/25/2020   CO2 24 09/25/2020   GLUCOSE 124 (H) 09/25/2020   BUN 10 09/25/2020   CREATININE 1.15 09/25/2020   BILITOT 1.6 (H) 09/25/2020   ALKPHOS 88 09/25/2020   AST 13 09/25/2020   ALT 16 09/25/2020   PROT 6.7 09/25/2020   ALBUMIN 4.3 09/25/2020   CALCIUM 9.6 09/25/2020   ANIONGAP 13 11/29/2018   EGFR 90 09/25/2020   Lab Results  Component Value Date   CHOL 142 09/25/2020   Lab Results   Component Value Date   HDL 36 (L) 09/25/2020   Lab Results  Component Value Date   LDLCALC 86 09/25/2020   Lab Results  Component Value Date   TRIG 110 09/25/2020   No results found for: CHOLHDL Lab Results  Component Value Date   HGBA1C 5.1 09/25/2020       Assessment & Plan:   Problem List Items Addressed This Visit       Endocrine   Impaired fasting glucose    Lab Results  Component Value Date   HGBA1C 5.1 09/25/2020          Other   ADHD - Primary   Relevant Medications   amphetamine-dextroamphetamine (ADDERALL) 20 MG tablet     Meds ordered this encounter  Medications   amphetamine-dextroamphetamine (ADDERALL) 20 MG tablet    Sig: TAKE ONE TABLET ($RemoveBef'20MG'JhOaTMSRcJ$  TOTAL) BY MOUTH DAILY    Dispense:  30 tablet    Refill:  0     Noreene Larsson, NP

## 2021-05-28 ENCOUNTER — Other Ambulatory Visit: Payer: Self-pay | Admitting: Nurse Practitioner

## 2021-05-28 DIAGNOSIS — F909 Attention-deficit hyperactivity disorder, unspecified type: Secondary | ICD-10-CM

## 2021-06-04 ENCOUNTER — Ambulatory Visit: Payer: Self-pay

## 2021-06-04 ENCOUNTER — Encounter: Payer: Self-pay | Admitting: Orthopedic Surgery

## 2021-06-04 ENCOUNTER — Ambulatory Visit (INDEPENDENT_AMBULATORY_CARE_PROVIDER_SITE_OTHER): Payer: No Typology Code available for payment source | Admitting: Orthopedic Surgery

## 2021-06-04 ENCOUNTER — Other Ambulatory Visit: Payer: Self-pay

## 2021-06-04 VITALS — BP 136/98 | HR 80 | Ht 72.0 in | Wt 203.0 lb

## 2021-06-04 DIAGNOSIS — M542 Cervicalgia: Secondary | ICD-10-CM

## 2021-06-04 MED ORDER — TIZANIDINE HCL 4 MG PO TABS: 4.0000 mg | ORAL_TABLET | Freq: Every day | ORAL | 1 refills | Status: AC

## 2021-06-04 MED ORDER — MELOXICAM 7.5 MG PO TABS: 7.5000 mg | ORAL_TABLET | Freq: Every day | ORAL | 5 refills | Status: DC

## 2021-06-04 NOTE — Addendum Note (Signed)
Addended byCaffie Damme on: 06/04/2021 10:41 AM   Modules accepted: Orders

## 2021-06-04 NOTE — Progress Notes (Signed)
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 Rex position  He has had multiple visits to the chiropractor without relief and comes in today for evaluation and treatment  Past Medical History:  Diagnosis Date   ADHD     BP (!) 136/98   Pulse 80   Ht 6' (1.829 m)   Wt 203 lb (92.1 kg)   BMI 27.53 kg/m    General appearance: Well-developed well-nourished no gross deformities  Cardiovascular normal pulse and perfusion normal color without edema  Neurologically no sensation loss or deficits or pathologic reflexes  Psychological: Awake alert and oriented x3 mood and affect normal  Skin no lacerations or ulcerations no nodularity no palpable masses, no erythema or nodularity  Musculoskeletal: Normal examination of both shoulders in terms of strength and range of motion there is no instability  He does have tenderness on the right side of the base of his neck into the trapezius muscle and upper scapula he has decreased range of motion in the cervical spine  Imaging C-spine x-rays were normal  A/P 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  The shoulder seems normal.  Recommend MRI c spine   Nsaids and mrs   Meds ordered this encounter  Medications   meloxicam (MOBIC) 7.5 MG tablet    Sig: Take 1 tablet (7.5 mg total) by mouth daily.    Dispense:  30 tablet    Refill:  5   tiZANidine (ZANAFLEX) 4 MG tablet    Sig: Take 1 tablet (4 mg total) by mouth daily.    Dispense:  30 tablet    Refill:  1

## 2021-06-04 NOTE — Patient Instructions (Signed)
.  awlcen While we are working on your approval for MRI please go ahead and call to schedule your appointment with [facility] within at least one (1) week.   Central Scheduling [phone]

## 2021-06-15 ENCOUNTER — Ambulatory Visit (HOSPITAL_COMMUNITY)
Admission: RE | Admit: 2021-06-15 | Discharge: 2021-06-15 | Disposition: A | Discharge status: Home / Self Care | Payer: No Typology Code available for payment source | Source: Ambulatory Visit | Attending: Orthopedic Surgery | Admitting: Orthopedic Surgery

## 2021-06-15 ENCOUNTER — Other Ambulatory Visit: Payer: Self-pay

## 2021-06-15 DIAGNOSIS — M542 Cervicalgia: Secondary | ICD-10-CM | POA: Insufficient documentation

## 2021-06-28 ENCOUNTER — Ambulatory Visit: Payer: Self-pay | Admitting: Nurse Practitioner

## 2021-07-16 ENCOUNTER — Other Ambulatory Visit: Payer: Self-pay | Admitting: Internal Medicine

## 2021-07-16 DIAGNOSIS — F909 Attention-deficit hyperactivity disorder, unspecified type: Secondary | ICD-10-CM

## 2021-07-18 ENCOUNTER — Other Ambulatory Visit: Payer: Self-pay

## 2021-07-18 ENCOUNTER — Ambulatory Visit: Payer: No Typology Code available for payment source | Admitting: Nurse Practitioner

## 2021-07-18 ENCOUNTER — Encounter: Payer: Self-pay | Admitting: Nurse Practitioner

## 2021-07-18 VITALS — BP 129/88 | HR 86 | Ht 72.0 in | Wt 204.1 lb

## 2021-07-18 DIAGNOSIS — Z79899 Other long term (current) drug therapy: Secondary | ICD-10-CM

## 2021-07-18 DIAGNOSIS — F909 Attention-deficit hyperactivity disorder, unspecified type: Secondary | ICD-10-CM

## 2021-07-18 NOTE — Progress Notes (Signed)
° °Acute Office Visit ° °Subjective:  ° ° Patient ID: Daniel Wallace, male    DOB: 06/07/1996, 25 y.o.   MRN: [id number] ° °Chief Complaint  °Patient presents with  ° Follow-up  °  3 month med check  ° ° °HPI °Patient is in today for med check. °He takes adderall for ADHD, but at his last OV, he was only taking adderall when he worked to help with attention. Previously, he was taking it more frequently when he was in BLET class. Now he is working with [employer], and is no longer in BLET. 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  ° Occupation: BLET student  °Tobacco Use  ° Smoking status: Never  ° Smokeless tobacco: Current  °  Types: Snuff, Chew  ° Tobacco comments:  °  started around age 14  °Vaping Use  ° Vaping Use: Former  °Substance and Sexual Activity  ° Alcohol use: Yes  °  Comment: rarely, maybe 1 drink per month  ° Drug use: No  ° Sexual activity: Yes  °  Birth control/protection: Pill  °Other Topics Concern  ° Not on file  °Social History Narrative  ° Not on file  ° °Social Determinants of Health  ° °Financial Resource Strain: Not on file  °Food Insecurity: Not on file  °Transportation Needs: Not on file  °Physical Activity: Not on file  °Stress: Not on file  °Social Connections: Not on file  °Intimate Partner Violence: Not on file  ° ° °Outpatient Medications Prior to Visit  °Medication Sig Dispense Refill  ° amphetamine-dextroamphetamine (ADDERALL) 20 MG tablet TAKE  ONE TABLET (20MG TOTAL) BY MOUTH DAILY 30 tablet 0  ° meloxicam (MOBIC) 7.5 MG tablet Take 1 tablet (7.5 mg total) by mouth daily. 30 tablet 5  ° tiZANidine (ZANAFLEX) 4 MG tablet Take 1 tablet (4 mg total) by mouth daily. 30 tablet 1  ° °No facility-administered medications prior to visit.  ° ° °No Known Allergies ° °Review of Systems  °Constitutional: Negative.   °Respiratory: Negative.    °Cardiovascular: Negative.   °Musculoskeletal: Negative.   °Psychiatric/Behavioral: Negative.    ° °   °Objective:  °  °Physical Exam °Constitutional:   °   Appearance: Normal appearance.  °Cardiovascular:  °   Rate and Rhythm: Normal rate and regular rhythm.  °   Pulses: Normal pulses.  °   Heart sounds: Normal heart sounds.  °Pulmonary:  °   Effort: Pulmonary effort is normal.  °   Breath sounds: Normal breath sounds.  °Musculoskeletal:     °   General: Normal range of motion.  °Neurological:  °   Mental Status: He is alert.  °Psychiatric:     °   Mood and Affect: Mood normal.     °     Behavior: Behavior normal.        Thought Content: Thought content normal.        Judgment: Judgment normal.    BP 129/88    Pulse 86    Ht 6' (1.829 m)    Wt 204 lb 1.3 oz (92.6 kg)    SpO2 96%    BMI 27.68 kg/m  Wt Readings from Last 3 Encounters:  07/18/21 204 lb 1.3 oz (92.6 kg)  06/04/21 203 lb (92.1 kg)  03/29/21 208 lb (94.3 kg)    There are no preventive care reminders to display for this patient.  There are no preventive care reminders to display for this patient.   No results found for: TSH Lab Results  Component Value Date   WBC 4.5 09/25/2020   HGB 14.9 09/25/2020   HCT 44.0 09/25/2020   MCV 90 09/25/2020   PLT 268 09/25/2020   Lab Results  Component Value Date   NA 139 09/25/2020   K 4.8 09/25/2020   CO2 24 09/25/2020   GLUCOSE 124 (H) 09/25/2020   BUN 10 09/25/2020   CREATININE 1.15 09/25/2020   BILITOT 1.6 (H) 09/25/2020   ALKPHOS 88 09/25/2020   AST 13 09/25/2020   ALT 16 09/25/2020   PROT  6.7 09/25/2020   ALBUMIN 4.3 09/25/2020   CALCIUM 9.6 09/25/2020   ANIONGAP 13 11/29/2018   EGFR 90 09/25/2020   Lab Results  Component Value Date   CHOL 142 09/25/2020   Lab Results  Component Value Date   HDL 36 (L) 09/25/2020   Lab Results  Component Value Date   LDLCALC 86 09/25/2020   Lab Results  Component Value Date   TRIG 110 09/25/2020   No results found for: [facility] Lab Results  Component Value Date   HGBA1C 5.1 09/25/2020       Assessment & Plan:   Problem List Items Addressed This Visit       Other   ADHD - Primary   Relevant Orders   [id number] 11+Oxyco+Alc+Crt-Bund   Other Visit Diagnoses     High risk medication use       Relevant Orders   [id number] 11+Oxyco+Alc+Crt-Bund        No orders of the defined types were placed in this encounter.    Noreene Larsson, NP

## 2021-07-18 NOTE — Patient Instructions (Signed)
I will be moving to [facility] located at [address], [place], [place] [address] effective Jul 29, 2021. °If you would like to establish care with [facility] please call [phone]. °

## 2021-07-20 LAB — DRUG SCREEN 764883 11+OXYCO+ALC+CRT-BUND
Amphetamines, Urine: NEGATIVE ng/mL
BENZODIAZ UR QL: NEGATIVE ng/mL
Barbiturate: NEGATIVE ng/mL
Cannabinoid Quant, Ur: NEGATIVE ng/mL
Cocaine (Metabolite): NEGATIVE ng/mL
Creatinine: 215.4 mg/dL (ref 20.0–300.0)
Ethanol: NEGATIVE %
Meperidine: NEGATIVE ng/mL
Methadone Screen, Urine: NEGATIVE ng/mL
OPIATE SCREEN URINE: NEGATIVE ng/mL
Oxycodone/Oxymorphone, Urine: NEGATIVE ng/mL
Phencyclidine: NEGATIVE ng/mL
Propoxyphene: NEGATIVE ng/mL
Tramadol: NEGATIVE ng/mL
pH, Urine: 7.2 (ref 4.5–8.9)

## 2021-07-20 NOTE — Progress Notes (Signed)
UDS was negative for amphetamines, but he only uses them at work, so that was expected.

## 2021-08-31 ENCOUNTER — Telehealth: Payer: Self-pay | Admitting: Internal Medicine

## 2021-08-31 ENCOUNTER — Other Ambulatory Visit: Payer: Self-pay | Admitting: Internal Medicine

## 2021-08-31 DIAGNOSIS — F909 Attention-deficit hyperactivity disorder, unspecified type: Secondary | ICD-10-CM

## 2021-08-31 MED ORDER — AMPHETAMINE-DEXTROAMPHETAMINE 20 MG PO TABS: 20.0000 mg | ORAL_TABLET | Freq: Every day | ORAL | 0 refills | Status: DC

## 2021-08-31 NOTE — Telephone Encounter (Signed)
Pt needs a refill on his Adderall

## 2021-10-22 ENCOUNTER — Other Ambulatory Visit: Payer: Self-pay

## 2021-10-22 ENCOUNTER — Encounter: Payer: Self-pay | Admitting: Internal Medicine

## 2021-10-22 ENCOUNTER — Ambulatory Visit: Payer: No Typology Code available for payment source | Admitting: Internal Medicine

## 2021-10-22 VITALS — BP 124/72 | HR 70 | Ht 72.0 in | Wt 195.2 lb

## 2021-10-22 DIAGNOSIS — Z0000 Encounter for general adult medical examination without abnormal findings: Secondary | ICD-10-CM

## 2021-10-22 DIAGNOSIS — E559 Vitamin D deficiency, unspecified: Secondary | ICD-10-CM | POA: Diagnosis not present

## 2021-10-22 DIAGNOSIS — F909 Attention-deficit hyperactivity disorder, unspecified type: Secondary | ICD-10-CM | POA: Diagnosis not present

## 2021-10-22 MED ORDER — AMPHETAMINE-DEXTROAMPHETAMINE 20 MG PO TABS: 20.0000 mg | ORAL_TABLET | Freq: Every day | ORAL | 0 refills | Status: DC

## 2021-10-22 NOTE — Patient Instructions (Addendum)
Please continue taking medications as prescribed.   Please get fasting blood tests done before the next visit. 

## 2021-10-25 NOTE — Progress Notes (Signed)
? ?Established Patient Office Visit ? ?Subjective:  ?Patient ID: Daniel Wallace, male    DOB: [birth year]  Age: 26 y.o. MRN: [id number] ? ?CC:  ?Chief Complaint  ?Patient presents with  ? Follow-up  ?  Following up   ? ? ?HPI ?Daniel Wallace is a 26 y.o. male with past medical history of ADHD who presents for f/u of his chronic medical conditions. ? ?ADHD: He has been doing well with Adderall now.  He takes it on the days when he goes to work.  He works in Transport planner currently.  He currently denies any episodes of hyperactivity.  His attention span is better with Adderall.  Denies any chest pain, dizziness or palpitations. ? ? ? ? ?Past Medical History:  ?Diagnosis Date  ? ADHD   ? ? ?Past Surgical History:  ?Procedure Laterality Date  ? APPENDECTOMY    ? LASIK    ? both eyes around 2017  ? SHOULDER SURGERY Right 2015  ? ? ?Family History  ?Problem Relation Age of Onset  ? Diabetes Mother   ? Diabetes Father   ? Stroke Father   ? ? ?Social History  ? ?Socioeconomic History  ? Marital status: Single  ?  Spouse name: Not on file  ? Number of children: Not on file  ? Years of education: Not on file  ? Highest education level: Not on file  ?Occupational History  ? 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 Occupation: Management consultant  ?Tobacco Use  ? Smoking status: Never  ? Smokeless tobacco: Current  ?  Types: Snuff, Chew  ? Tobacco comments:  ?  started around age 60  ?Vaping Use  ? Vaping Use: Former  ?Substance and Sexual Activity  ? Alcohol use: Yes  ?  Comment: rarely, maybe 1 drink per month  ? Drug use: No  ? Sexual activity: Yes  ?  Birth control/protection: Pill  ?Other Topics Concern  ? Not on file  ?Social History Narrative  ? Not on file  ? ?Social Determinants of Health  ? ?Financial Resource Strain: Not on file  ?Food Insecurity: Not on file  ?Transportation Needs: Not on file  ?Physical Activity: Not on file  ?Stress: Not on file  ?Social Connections: Not on file  ?Intimate Partner  Violence: Not on file  ? ? ?Outpatient Medications Prior to Visit  ?Medication Sig Dispense Refill  ? meloxicam (MOBIC) 7.5 MG tablet Take 1 tablet (7.5 mg total) by mouth daily. 30 tablet 5  ? tiZANidine (ZANAFLEX) 4 MG tablet Take 1 tablet (4 mg total) by mouth daily. 30 tablet 1  ? amphetamine-dextroamphetamine (ADDERALL) 20 MG tablet Take 1 tablet (20 mg total) by mouth daily. 30 tablet 0  ? ?No facility-administered medications prior to visit.  ? ? ?No Known Allergies ? ?ROS ?Review of Systems  ?Constitutional:  Negative for chills and fever.  ?HENT:  Negative for congestion and sore throat.   ?Eyes:  Negative for pain and discharge.  ?Respiratory:  Negative for cough and shortness of breath.   ?Cardiovascular:  Negative for chest pain and palpitations.  ?Gastrointestinal:  Negative for constipation, diarrhea, nausea and vomiting.  ?Endocrine: Negative for polydipsia and polyuria.  ?Genitourinary:  Negative for dysuria and hematuria.  ?Musculoskeletal:  Negative for neck pain and neck stiffness.  ?Skin:  Negative for rash.  ?Neurological:  Negative for dizziness, weakness, numbness and headaches.  ?Psychiatric/Behavioral:  Negative for agitation and behavioral problems.   ? ?  ?  Objective:  ?  ?Physical Exam ?Vitals reviewed.  ?Constitutional:   ?   General: He is not in acute distress. ?   Appearance: He is not diaphoretic.  ?HENT:  ?   Head: Normocephalic and atraumatic.  ?   Nose: Nose normal.  ?   Mouth/Throat:  ?   Mouth: Mucous membranes are moist.  ?Eyes:  ?   General: No scleral icterus. ?   Extraocular Movements: Extraocular movements intact.  ?Cardiovascular:  ?   Rate and Rhythm: Normal rate and regular rhythm.  ?   Pulses: Normal pulses.  ?   Heart sounds: Normal heart sounds. No murmur heard. ?Pulmonary:  ?   Breath sounds: Normal breath sounds. No wheezing or rales.  ?Musculoskeletal:  ?   Cervical back: Neck supple. No tenderness.  ?   Right lower leg: No edema.  ?   Left lower leg: No edema.   ?Skin: ?   General: Skin is warm.  ?   Findings: No rash.  ?Neurological:  ?   General: No focal deficit present.  ?   Mental Status: He is alert and oriented to person, place, and time.  ?   Sensory: No sensory deficit.  ?   Motor: No weakness.  ?Psychiatric:     ?   Mood and Affect: Mood normal.     ?   Behavior: Behavior normal.  ? ? ?BP 124/72 (BP Location: Right Arm)   Pulse 70   Ht 6' (1.829 m)   Wt 195 lb 3.2 oz (88.5 kg)   SpO2 99%   BMI 26.47 kg/m?  ?Wt Readings from Last 3 Encounters:  ?10/22/21 195 lb 3.2 oz (88.5 kg)  ?07/18/21 204 lb 1.3 oz (92.6 kg)  ?06/04/21 203 lb (92.1 kg)  ? ? ?No results found for: TSH ?Lab Results  ?Component Value Date  ? WBC 4.5 09/25/2020  ? HGB 14.9 09/25/2020  ? HCT 44.0 09/25/2020  ? MCV 90 09/25/2020  ? PLT 268 09/25/2020  ? ?Lab Results  ?Component Value Date  ? NA 139 09/25/2020  ? K 4.8 09/25/2020  ? CO2 24 09/25/2020  ? GLUCOSE 124 (H) 09/25/2020  ? BUN 10 09/25/2020  ? CREATININE 1.15 09/25/2020  ? BILITOT 1.6 (H) 09/25/2020  ? ALKPHOS 88 09/25/2020  ? AST 13 09/25/2020  ? ALT 16 09/25/2020  ? PROT 6.7 09/25/2020  ? ALBUMIN 4.3 09/25/2020  ? CALCIUM 9.6 09/25/2020  ? ANIONGAP 13 11/29/2018  ? EGFR 90 09/25/2020  ? ?Lab Results  ?Component Value Date  ? CHOL 142 09/25/2020  ? ?Lab Results  ?Component Value Date  ? HDL 36 (L) 09/25/2020  ? ?Lab Results  ?Component Value Date  ? [place] 86 09/25/2020  ? ?Lab Results  ?Component Value Date  ? TRIG 110 09/25/2020  ? ?No results found for: CHOLHDL ?Lab Results  ?Component Value Date  ? HGBA1C 5.1 09/25/2020  ? ? ?  ?Assessment & Plan:  ? ?Problem List Items Addressed This Visit   ? ?  ? Other  ? ADHD - Primary  ?  Overall well controlled with Adderall now, takes it on the days when he goes to work, helps him with attention ?No episodes of hyperactivity ?We will check UDS in the next visit ?  ?  ? Relevant Medications  ? amphetamine-dextroamphetamine (ADDERALL) 20 MG tablet  ? ?Other Visit Diagnoses   ? ? Annual  physical exam      ? Relevant Orders  ?  TSH  ? Hemoglobin A1c  ? CMP14+EGFR  ? CBC with Differential/Platelet  ? Lipid Profile  ? Vitamin D deficiency      ? Relevant Orders  ? VITAMIN D 25 Hydroxy (Vit-D Deficiency, Fractures)  ? ?  ? ? ?Meds ordered this encounter  ?Medications  ? amphetamine-dextroamphetamine (ADDERALL) 20 MG tablet  ?  Sig: Take 1 tablet (20 mg total) by mouth daily.  ?  Dispense:  30 tablet  ?  Refill:  0  ? ? ?Follow-up: Return in about 3 months (around 01/22/2022) for Annual physical.  ? ? ?Lindell Spar, MD ?

## 2021-10-25 NOTE — Assessment & Plan Note (Signed)
Overall well controlled with Adderall now, takes it on the days when he goes to work, helps him with attention No episodes of hyperactivity We will check UDS in the next visit 

## 2021-12-12 ENCOUNTER — Other Ambulatory Visit: Payer: Self-pay | Admitting: Internal Medicine

## 2021-12-12 DIAGNOSIS — F909 Attention-deficit hyperactivity disorder, unspecified type: Secondary | ICD-10-CM

## 2021-12-29 LAB — CMP14+EGFR
ALT: 15 IU/L (ref 0–44)
AST: 13 IU/L (ref 0–40)
Albumin/Globulin Ratio: 3 — ABNORMAL HIGH (ref 1.2–2.2)
Albumin: 5.1 g/dL (ref 4.1–5.2)
Alkaline Phosphatase: 66 IU/L (ref 44–121)
BUN/Creatinine Ratio: 9 (ref 9–20)
BUN: 10 mg/dL (ref 6–20)
Bilirubin Total: 0.9 mg/dL (ref 0.0–1.2)
CO2: 25 mmol/L (ref 20–29)
Calcium: 9.4 mg/dL (ref 8.7–10.2)
Chloride: 102 mmol/L (ref 96–106)
Creatinine, Ser: 1.11 mg/dL (ref 0.76–1.27)
Globulin, Total: 1.7 g/dL (ref 1.5–4.5)
Glucose: 91 mg/dL (ref 70–99)
Potassium: 5.3 mmol/L — ABNORMAL HIGH (ref 3.5–5.2)
Sodium: 140 mmol/L (ref 134–144)
Total Protein: 6.8 g/dL (ref 6.0–8.5)
eGFR: 93 mL/min/1.73

## 2021-12-29 LAB — CBC WITH DIFFERENTIAL/PLATELET
Basophils Absolute: 0.1 10*3/uL (ref 0.0–0.2)
Basos: 1 %
EOS (ABSOLUTE): 0.1 10*3/uL (ref 0.0–0.4)
Eos: 2 %
Hematocrit: 49.5 % (ref 37.5–51.0)
Hemoglobin: 17.1 g/dL (ref 13.0–17.7)
Immature Grans (Abs): 0 10*3/uL (ref 0.0–0.1)
Immature Granulocytes: 0 %
Lymphocytes Absolute: 2.8 10*3/uL (ref 0.7–3.1)
Lymphs: 42 %
MCH: 31.9 pg (ref 26.6–33.0)
MCHC: 34.5 g/dL (ref 31.5–35.7)
MCV: 92 fL (ref 79–97)
Monocytes Absolute: 0.7 10*3/uL (ref 0.1–0.9)
Monocytes: 10 %
Neutrophils Absolute: 2.9 10*3/uL (ref 1.4–7.0)
Neutrophils: 45 %
Platelets: 264 10*3/uL (ref 150–450)
RBC: 5.36 x10E6/uL (ref 4.14–5.80)
RDW: 12.7 % (ref 11.6–15.4)
WBC: 6.6 10*3/uL (ref 3.4–10.8)

## 2021-12-29 LAB — HEMOGLOBIN A1C
Est. average glucose Bld gHb Est-mCnc: 103 mg/dL
Hgb A1c MFr Bld: 5.2 % (ref 4.8–5.6)

## 2021-12-29 LAB — LIPID PANEL
Chol/HDL Ratio: 3.8 ratio (ref 0.0–5.0)
Cholesterol, Total: 166 mg/dL (ref 100–199)
HDL: 44 mg/dL (ref >39)
LDL Chol Calc (NIH): 103 mg/dL — ABNORMAL HIGH (ref 0–99)
Triglycerides: 106 mg/dL (ref 0–149)
VLDL Cholesterol Cal: 19 mg/dL (ref 5–40)

## 2021-12-29 LAB — VITAMIN D 25 HYDROXY (VIT D DEFICIENCY, FRACTURES): Vit D, 25-Hydroxy: 30.7 ng/mL (ref 30.0–100.0)

## 2021-12-29 LAB — TSH: TSH: 2.87 u[IU]/mL (ref 0.450–4.500)

## 2022-01-02 ENCOUNTER — Encounter: Payer: No Typology Code available for payment source | Admitting: Internal Medicine

## 2022-01-24 ENCOUNTER — Other Ambulatory Visit: Payer: Self-pay | Admitting: Internal Medicine

## 2022-01-24 DIAGNOSIS — F909 Attention-deficit hyperactivity disorder, unspecified type: Secondary | ICD-10-CM

## 2022-01-28 ENCOUNTER — Encounter: Payer: Self-pay | Admitting: Internal Medicine

## 2022-01-28 ENCOUNTER — Ambulatory Visit (INDEPENDENT_AMBULATORY_CARE_PROVIDER_SITE_OTHER): Payer: No Typology Code available for payment source | Admitting: Internal Medicine

## 2022-01-28 VITALS — BP 119/84 | HR 86 | Ht 72.0 in | Wt 210.2 lb

## 2022-01-28 DIAGNOSIS — F909 Attention-deficit hyperactivity disorder, unspecified type: Secondary | ICD-10-CM

## 2022-01-28 DIAGNOSIS — Z0001 Encounter for general adult medical examination with abnormal findings: Secondary | ICD-10-CM

## 2022-01-28 NOTE — Patient Instructions (Signed)
Please continue taking medications as prescribed.  Please continue to perform moderate exercise/walking at least 150 mins/week. 

## 2022-02-01 DIAGNOSIS — Z0001 Encounter for general adult medical examination with abnormal findings: Secondary | ICD-10-CM | POA: Insufficient documentation

## 2022-02-01 NOTE — Assessment & Plan Note (Signed)
Physical exam as documented. Fasting blood tests reviewed. 

## 2022-02-01 NOTE — Progress Notes (Signed)
Established Patient Office Visit  Subjective:  Patient ID: JOAB CARDEN, male    DOB: 1996/07/09  Age: 26 y.o. MRN: [id number]  CC:  Chief Complaint  Patient presents with   Annual Exam    CPE    HPI THERESA DOHRMAN is a 26 y.o. male with past medical history of ADHD who presents for annual physical.  ADHD: He has been doing well with Adderall now.  He takes it on the days when he goes to work.  He works in Programmer, multimedia currently.  He currently denies any episodes of hyperactivity.  His attention span is better with Adderall.  Denies any chest pain, dizziness or palpitations.     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   Occupation: Occupational psychologist  Tobacco Use   Smoking status: Never   Smokeless tobacco: Current    Types: Snuff, Chew   Tobacco comments:    started around age 42  Vaping Use   Vaping Use: Former  Substance and Sexual Activity   Alcohol use: Yes    Comment: rarely, maybe 1 drink per month   Drug use: No   Sexual activity: Yes    Birth control/protection: Pill  Other Topics Concern   Not on file  Social History Narrative   Not on file   Social Determinants of Health   Financial Resource Strain: Not on file  Food Insecurity: Not on file  Transportation Needs: Not on file  Physical Activity: Not on file  Stress: Not on file  Social Connections: Not on file  Intimate Partner Violence: Not on file     Outpatient Medications Prior to Visit  Medication Sig Dispense Refill   amphetamine-dextroamphetamine (ADDERALL) 20 MG tablet TAKE ONE TABLET (20MG  TOTAL) BY MOUTH DAILY 30 tablet 0   tiZANidine (ZANAFLEX) 4 MG tablet Take 1 tablet (4 mg total) by mouth daily. 30 tablet 1   meloxicam (MOBIC) 7.5 MG tablet Take 1 tablet (7.5 mg total) by mouth daily. 30 tablet 5   No facility-administered medications prior to visit.    No Known Allergies  ROS Review of Systems  Constitutional:  Negative for chills and fever.  HENT:  Negative for congestion and sore throat.   Eyes:  Negative for pain and discharge.  Respiratory:  Negative for cough and shortness of breath.   Cardiovascular:  Negative for chest pain and palpitations.  Gastrointestinal:  Negative for constipation, diarrhea, nausea and vomiting.  Endocrine: Negative for polydipsia and polyuria.  Genitourinary:  Negative for dysuria and hematuria.  Musculoskeletal:  Negative for neck pain and neck stiffness.  Skin:  Negative for rash.  Neurological:  Negative for dizziness, weakness, numbness and headaches.  Psychiatric/Behavioral:  Negative for agitation and behavioral problems.       Objective:  Physical Exam Vitals reviewed.  Constitutional:      General: He is not in acute distress.    Appearance: He is not diaphoretic.  HENT:     Head: Normocephalic and atraumatic.     Nose: Nose normal.     Mouth/Throat:     Mouth: Mucous membranes are moist.  Eyes:     General: No scleral icterus.    Extraocular Movements: Extraocular movements intact.  Cardiovascular:     Rate and Rhythm: Normal rate and regular rhythm.     Pulses: Normal pulses.     Heart sounds: Normal heart sounds. No murmur heard. Pulmonary:     Breath sounds: Normal breath sounds. No wheezing or rales.  Abdominal:     Palpations: Abdomen is soft.     Tenderness: There is no abdominal tenderness.  Musculoskeletal:     Cervical back: Neck supple. No  tenderness.     Right lower leg: No edema.     Left lower leg: No edema.  Skin:    General: Skin is warm.     Findings: No rash.  Neurological:     General: No focal deficit present.     Mental Status: He is alert and oriented to person, place, and time.     Cranial Nerves: No cranial nerve deficit.     Sensory: No sensory deficit.     Motor: No weakness.  Psychiatric:        Mood and Affect: Mood normal.        Behavior: Behavior normal.     BP 119/84   Pulse 86   Ht 6' (1.829 m)   Wt 210 lb 3.2 oz (95.3 kg)   SpO2 97%   BMI 28.51 kg/m  Wt Readings from Last 3 Encounters:  01/28/22 210 lb 3.2 oz (95.3 kg)  10/22/21 195 lb 3.2 oz (88.5 kg)  07/18/21 204 lb 1.3 oz (92.6 kg)    Lab Results  Component Value Date   TSH 2.870 12/28/2021   Lab Results  Component Value Date   WBC 6.6 12/28/2021   HGB 17.1 12/28/2021   HCT 49.5 12/28/2021   MCV 92 12/28/2021   PLT 264 12/28/2021   Lab Results  Component Value Date   NA 140 12/28/2021   K 5.3 (H) 12/28/2021   CO2 25 12/28/2021   GLUCOSE 91 12/28/2021   BUN 10 12/28/2021   CREATININE 1.11 12/28/2021   BILITOT 0.9 12/28/2021   ALKPHOS 66 12/28/2021   AST 13 12/28/2021   ALT 15 12/28/2021   PROT 6.8 12/28/2021   ALBUMIN 5.1 12/28/2021   CALCIUM 9.4 12/28/2021   ANIONGAP 13 11/29/2018   EGFR 93 12/28/2021   Lab Results  Component Value Date   CHOL 166 12/28/2021   Lab Results  Component Value Date   HDL 44 12/28/2021   Lab Results  Component Value Date   LDLCALC 103 (H) 12/28/2021   Lab Results  Component Value Date   TRIG 106 12/28/2021   Lab Results  Component Value Date   CHOLHDL 3.8 12/28/2021   Lab Results  Component Value Date   HGBA1C 5.2 12/28/2021      Assessment & Plan:   Problem List Items Addressed This Visit       Other   ADHD    Overall well controlled with Adderall now, takes it on the days when he goes to work, helps him with attention No episodes of hyperactivity We  will check UDS in the  next visit      Encounter for general adult medical examination with abnormal findings - Primary    Physical exam as documented. Fasting blood tests reviewed.       No orders of the defined types were placed in this encounter.   Follow-up: Return in about 4 months (around 05/31/2022) for ADHD.    Lindell Spar, MD

## 2022-02-01 NOTE — Assessment & Plan Note (Signed)
Overall well controlled with Adderall now, takes it on the days when he goes to work, helps him with attention No episodes of hyperactivity We will check UDS in the next visit

## 2022-03-11 ENCOUNTER — Other Ambulatory Visit: Payer: Self-pay | Admitting: Internal Medicine

## 2022-03-11 DIAGNOSIS — F909 Attention-deficit hyperactivity disorder, unspecified type: Secondary | ICD-10-CM

## 2022-05-07 ENCOUNTER — Other Ambulatory Visit: Payer: Self-pay | Admitting: Internal Medicine

## 2022-05-07 DIAGNOSIS — F909 Attention-deficit hyperactivity disorder, unspecified type: Secondary | ICD-10-CM

## 2022-06-14 ENCOUNTER — Ambulatory Visit: Payer: No Typology Code available for payment source | Admitting: Internal Medicine

## 2022-06-19 ENCOUNTER — Other Ambulatory Visit: Payer: Self-pay | Admitting: Internal Medicine

## 2022-06-19 DIAGNOSIS — F909 Attention-deficit hyperactivity disorder, unspecified type: Secondary | ICD-10-CM

## 2022-06-26 ENCOUNTER — Ambulatory Visit: Payer: No Typology Code available for payment source | Admitting: Internal Medicine

## 2022-07-23 ENCOUNTER — Ambulatory Visit: Payer: No Typology Code available for payment source | Admitting: Internal Medicine

## 2022-07-23 ENCOUNTER — Encounter: Payer: Self-pay | Admitting: Internal Medicine

## 2022-08-23 ENCOUNTER — Other Ambulatory Visit: Payer: Self-pay | Admitting: Internal Medicine

## 2022-08-23 DIAGNOSIS — F909 Attention-deficit hyperactivity disorder, unspecified type: Secondary | ICD-10-CM

## 2022-08-30 ENCOUNTER — Ambulatory Visit: Payer: No Typology Code available for payment source | Admitting: Internal Medicine

## 2022-08-30 ENCOUNTER — Encounter: Payer: Self-pay | Admitting: Internal Medicine

## 2022-08-30 VITALS — BP 126/85 | HR 70 | Ht 72.0 in | Wt 204.6 lb

## 2022-08-30 DIAGNOSIS — R5382 Chronic fatigue, unspecified: Secondary | ICD-10-CM | POA: Insufficient documentation

## 2022-08-30 DIAGNOSIS — F909 Attention-deficit hyperactivity disorder, unspecified type: Secondary | ICD-10-CM

## 2022-08-30 MED ORDER — AMPHETAMINE-DEXTROAMPHETAMINE 20 MG PO TABS: ORAL_TABLET | ORAL | 0 refills | Status: DC

## 2022-08-30 NOTE — Progress Notes (Signed)
Established Patient Office Visit  Subjective:  Patient ID: Daniel Wallace, male    DOB: [birth year]  Age: 27 y.o. MRN: [id number]  CC:  Chief Complaint  Patient presents with   ADHD    Follow up for ADHD     HPI Daniel Wallace is a 27 y.o. male with past medical history of ADHD who presents for f/u of his chronic medical conditions.  ADHD: He had been doing well with Adderall now, but has had worsening of his fatigue recently.  He used to take Adderall only on his work shift days, but he agrees to take it on daily basis now.  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  Due to his swinging shifts, he has to take Adderall at different times when he wakes up.  He currently denies any episodes of hyperactivity.  His attention span is overall better with Adderall.  Denies any chest pain, dizziness or palpitations.     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  Tobacco Use   Smoking status: Never   Smokeless tobacco: Current    Types: Snuff, Chew   Tobacco comments:    started around age 34  Vaping Use   Vaping Use: Former  Substance and Sexual Activity   Alcohol use: Yes    Comment: rarely, maybe 1 drink per month   Drug use: No   Sexual activity: Yes    Birth control/protection: Pill  Other Topics Concern   Not on file  Social History  Narrative   Not on file   Social Determinants of Health   Financial Resource Strain: Not on file  Food Insecurity: Not on file  Transportation Needs: Not on file  Physical Activity: Not on file  Stress: Not on file  Social Connections: Not on file  Intimate Partner Violence: Not on file    Outpatient Medications Prior to Visit  Medication Sig Dispense Refill   amphetamine-dextroamphetamine (ADDERALL) 20 MG tablet TAKE ONE TABLET (20MG  TOTAL) BY MOUTH DAILY 5 tablet 0   No facility-administered medications prior to visit.    No Known Allergies  ROS Review of Systems  Constitutional:  Positive for fatigue. Negative for chills and fever.  HENT:  Negative for congestion and sore throat.   Eyes:  Negative for pain and discharge.  Respiratory:  Negative for cough and shortness of breath.   Cardiovascular:  Negative for chest pain and palpitations.  Gastrointestinal:  Negative for constipation, diarrhea, nausea and vomiting.  Endocrine: Negative for polydipsia and polyuria.  Genitourinary:  Negative for dysuria and hematuria.  Musculoskeletal:  Negative for neck pain and neck stiffness.  Skin:  Negative for rash.  Neurological:  Negative for dizziness, weakness, numbness and headaches.  Psychiatric/Behavioral:  Positive for decreased concentration. Negative for agitation and behavioral problems.       Objective:    Physical Exam Vitals reviewed.  Constitutional:      General: He is not in acute distress.    Appearance: He is not diaphoretic.  HENT:     Head: Normocephalic and atraumatic.     Nose: Nose normal.     Mouth/Throat:     Mouth: Mucous membranes are moist.  Eyes:     General: No scleral icterus.    Extraocular Movements: Extraocular movements intact.  Cardiovascular:     Rate and Rhythm: Normal rate and regular rhythm.     Pulses: Normal pulses.     Heart sounds: Normal heart sounds. No murmur heard. Pulmonary:     Breath sounds: Normal breath sounds. No  wheezing or rales.  Musculoskeletal:     Cervical back: Neck supple. No tenderness.     Right lower leg: No edema.     Left lower leg: No edema.  Skin:    General: Skin is warm.     Findings: No rash.  Neurological:     General: No focal deficit present.     Mental Status: He is alert and oriented to person, place, and time.     Sensory: No sensory deficit.     Motor: No weakness.  Psychiatric:        Mood and Affect: Mood normal.        Behavior: Behavior normal.     BP 126/85 (BP Location: Left Arm, Patient Position: Sitting, Cuff Size: Normal)   Pulse 70   Ht 6' (1.829 m)   Wt 204 lb 9.6 oz (92.8 kg)   SpO2 96%   BMI 27.75 kg/m  Wt Readings from Last 3 Encounters:  08/30/22 204 lb 9.6 oz (92.8 kg)  01/28/22 210 lb 3.2 oz (95.3 kg)  10/22/21 195 lb 3.2 oz (88.5 kg)    Lab Results  Component Value Date   TSH 2.870 12/28/2021   Lab Results  Component Value Date   WBC 6.6 12/28/2021   HGB 17.1 12/28/2021   HCT 49.5 12/28/2021   MCV 92 12/28/2021   PLT 264 12/28/2021   Lab Results  Component Value Date   NA 140 12/28/2021   K 5.3 (H) 12/28/2021   CO2 25 12/28/2021   GLUCOSE 91 12/28/2021   BUN 10 12/28/2021   CREATININE 1.11 12/28/2021   BILITOT 0.9 12/28/2021   ALKPHOS 66 12/28/2021   AST 13 12/28/2021   ALT 15 12/28/2021   PROT 6.8 12/28/2021   ALBUMIN 5.1 12/28/2021   CALCIUM 9.4 12/28/2021   ANIONGAP 13 11/29/2018   EGFR 93 12/28/2021   Lab Results  Component Value Date   CHOL 166 12/28/2021   Lab Results  Component Value Date   HDL 44 12/28/2021   Lab Results  Component Value Date   LDLCALC 103 (H) 12/28/2021   Lab Results  Component Value Date   TRIG 106 12/28/2021   Lab Results  Component Value Date   CHOLHDL 3.8 12/28/2021   Lab Results  Component Value Date   HGBA1C 5.2 12/28/2021      Assessment & Plan:   Problem List Items Addressed This Visit       Other   ADHD - Primary    Overall well controlled with Adderall  now - used to take it on the days when he goes to  work, helps him with attention - advised to take QD No episodes of hyperactivity Check UDS His symptoms are worse due to his work schedule      Relevant Medications   amphetamine-dextroamphetamine (ADDERALL) 20 MG tablet   Other Relevant Orders   ToxASSURE Select 13 (MW), Urine   Chronic fatigue    Likely related to sleep wake cycle disruptions No other B symptoms currently Reviewed CBC, CMP, TSH -overall WNL On Adderall for ADD       Meds ordered this encounter  Medications   amphetamine-dextroamphetamine (ADDERALL) 20 MG tablet    Sig: TAKE ONE TABLET (20MG  TOTAL) BY MOUTH DAILY    Dispense:  30 tablet    Refill:  0    Follow-up: Return in about 4 months (around 12/29/2022) for ADD.    Lindell Spar, MD

## 2022-08-30 NOTE — Assessment & Plan Note (Signed)
Likely related to sleep wake cycle disruptions No other B symptoms currently Reviewed CBC, CMP, TSH -overall WNL On Adderall for ADD

## 2022-08-30 NOTE — Assessment & Plan Note (Signed)
Overall well controlled with Adderall now - used to take it on the days when he goes to work, helps him with attention - advised to take QD No episodes of hyperactivity Check UDS His symptoms are worse due to his work schedule

## 2022-08-30 NOTE — Patient Instructions (Signed)
Please continue to take Adderall as prescribed.  Please continue to follow heart healthy diet and perform moderate exercise/walking at least 150 mins/week.

## 2022-09-04 LAB — TOXASSURE SELECT 13 (MW), URINE

## 2022-09-26 ENCOUNTER — Encounter: Payer: Self-pay | Admitting: Radiology

## 2022-10-10 ENCOUNTER — Other Ambulatory Visit: Payer: Self-pay | Admitting: Internal Medicine

## 2022-10-10 DIAGNOSIS — F909 Attention-deficit hyperactivity disorder, unspecified type: Secondary | ICD-10-CM

## 2022-11-25 ENCOUNTER — Other Ambulatory Visit: Payer: Self-pay | Admitting: Internal Medicine

## 2022-11-25 DIAGNOSIS — F909 Attention-deficit hyperactivity disorder, unspecified type: Secondary | ICD-10-CM

## 2022-12-27 ENCOUNTER — Other Ambulatory Visit: Payer: Self-pay | Admitting: Internal Medicine

## 2022-12-27 DIAGNOSIS — F909 Attention-deficit hyperactivity disorder, unspecified type: Secondary | ICD-10-CM

## 2022-12-30 ENCOUNTER — Ambulatory Visit: Payer: No Typology Code available for payment source | Admitting: Internal Medicine

## 2023-01-03 ENCOUNTER — Encounter: Payer: Self-pay | Admitting: Internal Medicine

## 2023-01-03 ENCOUNTER — Ambulatory Visit: Payer: No Typology Code available for payment source | Admitting: Internal Medicine

## 2023-01-03 VITALS — BP 116/77 | HR 66 | Ht 72.0 in | Wt 208.0 lb

## 2023-01-03 DIAGNOSIS — R5382 Chronic fatigue, unspecified: Secondary | ICD-10-CM

## 2023-01-03 DIAGNOSIS — E782 Mixed hyperlipidemia: Secondary | ICD-10-CM

## 2023-01-03 DIAGNOSIS — F909 Attention-deficit hyperactivity disorder, unspecified type: Secondary | ICD-10-CM | POA: Diagnosis not present

## 2023-01-03 DIAGNOSIS — R739 Hyperglycemia, unspecified: Secondary | ICD-10-CM

## 2023-01-03 DIAGNOSIS — E559 Vitamin D deficiency, unspecified: Secondary | ICD-10-CM

## 2023-01-03 NOTE — Progress Notes (Addendum)
Established Patient Office Visit  Subjective:  Patient ID: Daniel Wallace, male    DOB: 07-05-1996  Age: 27 y.o. MRN: [id number]  CC:  Chief Complaint  Patient presents with   Follow-up    Follow up on ADD,     HPI Daniel Wallace is a 27 y.o. male with past medical history of ADHD who presents for f/u of his chronic medical conditions.  ADHD: He had been doing well with Adderall now, but has had worsening of his fatigue recently.  He used to take Adderall only on his work shift days, but he has felt better since taking Adderall regularly. He works in Programmer, multimedia currently.  He has to do swinging shifts between night and days, which changes every 4 days.  Due to his swinging shifts, he has to take Adderall at different times when he wakes up.  He currently denies any episodes of hyperactivity.  His attention span is overall better with Adderall.  Denies any chest pain, dizziness or palpitations.     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   Occupation: Occupational psychologist  Tobacco Use   Smoking status: Never   Smokeless tobacco: Current    Types: Snuff, Chew   Tobacco comments:    started around age 58  Vaping Use   Vaping Use: Former  Substance and Sexual Activity   Alcohol use: Yes    Comment: rarely, maybe 1 drink per month   Drug use: No   Sexual activity: Yes    Birth control/protection: Pill  Other Topics Concern   Not on file   Social History Narrative   Not on file   Social Determinants of Health   Financial Resource Strain: Not on file  Food Insecurity: Not on file  Transportation Needs: Not on file  Physical Activity: Not on file  Stress: Not on file  Social Connections: Not on file  Intimate Partner Violence: Not on file    Outpatient Medications Prior to Visit  Medication Sig Dispense Refill   amphetamine-dextroamphetamine (ADDERALL) 20 MG tablet TAKE ONE TABLET (20MG  TOTAL) BY MOUTH DAILY 30 tablet 0   No facility-administered medications prior to visit.    No Known Allergies  ROS Review of Systems  Constitutional:  Positive for fatigue. Negative for chills and fever.  HENT:  Negative for congestion and sore throat.   Eyes:  Negative for pain and discharge.  Respiratory:  Negative for cough and shortness of breath.   Cardiovascular:  Negative for chest pain and palpitations.  Gastrointestinal:  Negative for constipation, diarrhea, nausea and vomiting.  Endocrine: Negative for polydipsia and polyuria.  Genitourinary:  Negative for dysuria and hematuria.  Musculoskeletal:  Negative for neck pain and neck stiffness.  Skin:  Negative for rash.  Neurological:  Negative for  dizziness, weakness, numbness and headaches.  Psychiatric/Behavioral:  Positive for decreased concentration. Negative for agitation and behavioral problems.       Objective:    Physical Exam Vitals reviewed.  Constitutional:      General: He is not in acute distress.    Appearance: He is not diaphoretic.  HENT:     Head: Normocephalic and atraumatic.     Nose: Nose normal.     Mouth/Throat:     Mouth: Mucous membranes are moist.  Eyes:     General: No scleral icterus.    Extraocular Movements: Extraocular movements intact.  Cardiovascular:     Rate and Rhythm: Normal rate and regular rhythm.     Pulses: Normal pulses.     Heart sounds: Normal heart sounds. No murmur heard. Pulmonary:     Breath sounds: Normal  breath sounds. No wheezing or rales.  Musculoskeletal:     Cervical back: Neck supple. No tenderness.     Right lower leg: No edema.     Left lower leg: No edema.  Skin:    General: Skin is warm.     Findings: No rash.  Neurological:     General: No focal deficit present.     Mental Status: He is alert and oriented to person, place, and time.     Sensory: No sensory deficit.     Motor: No weakness.  Psychiatric:        Mood and Affect: Mood normal.        Behavior: Behavior normal.     BP 116/77 (BP Location: Right Arm, Patient Position: Sitting, Cuff Size: Large)   Pulse 66   Ht 6' (1.829 m)   Wt 208 lb (94.3 kg)   SpO2 96%   BMI 28.21 kg/m  Wt Readings from Last 3 Encounters:  01/03/23 208 lb (94.3 kg)  08/30/22 204 lb 9.6 oz (92.8 kg)  01/28/22 210 lb 3.2 oz (95.3 kg)    Lab Results  Component Value Date   TSH 2.870 12/28/2021   Lab Results  Component Value Date   WBC 6.6 12/28/2021   HGB 17.1 12/28/2021   HCT 49.5 12/28/2021   MCV 92 12/28/2021   PLT 264 12/28/2021   Lab Results  Component Value Date   NA 140 12/28/2021   K 5.3 (H) 12/28/2021   CO2 25 12/28/2021   GLUCOSE 91 12/28/2021   BUN 10 12/28/2021   CREATININE 1.11 12/28/2021   BILITOT 0.9 12/28/2021   ALKPHOS 66 12/28/2021   AST 13 12/28/2021   ALT 15 12/28/2021   PROT 6.8 12/28/2021   ALBUMIN 5.1 12/28/2021   CALCIUM 9.4 12/28/2021   ANIONGAP 13 11/29/2018   EGFR 93 12/28/2021   Lab Results  Component Value Date   CHOL 166 12/28/2021   Lab Results  Component Value Date   HDL 44 12/28/2021   Lab Results  Component Value Date   LDLCALC 103 (H) 12/28/2021   Lab Results  Component Value Date   TRIG 106 12/28/2021   Lab Results  Component Value Date   CHOLHDL 3.8 12/28/2021   Lab Results  Component Value Date   HGBA1C 5.2 12/28/2021      Assessment & Plan:   Problem List Items Addressed This Visit       Other   ADHD - Primary    Overall well controlled with  Adderall now No episodes of hyperactivity Check UDS Check CBC, CMP, TSH      Relevant Orders  CMP14+EGFR   CBC with Differential/Platelet   Chronic fatigue    Likely related to sleep wake cycle disruptions No other B symptoms currently Check CBC, CMP, TSH On Adderall for ADD      Relevant Orders   TSH   CMP14+EGFR   CBC with Differential/Platelet   Other Visit Diagnoses     Vitamin D deficiency       Relevant Orders   VITAMIN D 25 Hydroxy (Vit-D Deficiency, Fractures)   Mixed hyperlipidemia       Relevant Orders   Lipid panel   Hyperglycemia       Relevant Orders   Hemoglobin A1c      No orders of the defined types were placed in this encounter.   Follow-up: Return in about 3 months (around 04/05/2023) for Annual physical.    Anabel Halon, MD

## 2023-01-03 NOTE — Assessment & Plan Note (Signed)
Overall well controlled with Adderall now No episodes of hyperactivity Check UDS Check CBC, CMP, TSH

## 2023-01-03 NOTE — Assessment & Plan Note (Signed)
Likely related to sleep wake cycle disruptions No other B symptoms currently Check CBC, CMP, TSH On Adderall for ADD

## 2023-01-03 NOTE — Patient Instructions (Signed)
Please continue to take medications as prescribed.  Please continue to follow low salt diet and perform moderate exercise/walking at least 150 mins/week.  Please get fasting blood tests done before the next visit. 

## 2023-02-06 ENCOUNTER — Other Ambulatory Visit: Payer: Self-pay | Admitting: Internal Medicine

## 2023-02-06 DIAGNOSIS — F909 Attention-deficit hyperactivity disorder, unspecified type: Secondary | ICD-10-CM

## 2023-03-18 ENCOUNTER — Other Ambulatory Visit: Payer: Self-pay | Admitting: Internal Medicine

## 2023-03-18 DIAGNOSIS — F909 Attention-deficit hyperactivity disorder, unspecified type: Secondary | ICD-10-CM

## 2023-04-08 ENCOUNTER — Encounter: Payer: No Typology Code available for payment source | Admitting: Internal Medicine
